# Patient Record
Sex: Male | Born: 1965 | Race: White | Hispanic: No | Marital: Married | Smoking: Never smoker
Health system: Southern US, Community
[De-identification: ages and names within clinical notes are randomized; demographics above are authoritative.]

## PROBLEM LIST (undated history)

## (undated) DIAGNOSIS — U071 COVID-19: Secondary | ICD-10-CM

## (undated) DIAGNOSIS — G4730 Sleep apnea, unspecified: Secondary | ICD-10-CM

## (undated) DIAGNOSIS — I499 Cardiac arrhythmia, unspecified: Secondary | ICD-10-CM

## (undated) HISTORY — PX: SPINAL FUSION: SHX223

## (undated) HISTORY — PX: ABLATION: SHX5711

---
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
## 2017-10-09 DIAGNOSIS — G4730 Sleep apnea, unspecified: Secondary | ICD-10-CM | POA: Diagnosis not present

## 2017-10-09 DIAGNOSIS — Z6834 Body mass index (BMI) 34.0-34.9, adult: Secondary | ICD-10-CM | POA: Diagnosis not present

## 2017-10-09 DIAGNOSIS — N6310 Unspecified lump in the right breast, unspecified quadrant: Secondary | ICD-10-CM | POA: Diagnosis not present

## 2017-10-09 DIAGNOSIS — N62 Hypertrophy of breast: Secondary | ICD-10-CM | POA: Diagnosis not present

## 2018-06-05 DIAGNOSIS — T7800XD Anaphylactic reaction due to unspecified food, subsequent encounter: Secondary | ICD-10-CM | POA: Diagnosis not present

## 2018-06-21 DIAGNOSIS — R197 Diarrhea, unspecified: Secondary | ICD-10-CM | POA: Diagnosis not present

## 2018-06-21 DIAGNOSIS — Z6832 Body mass index (BMI) 32.0-32.9, adult: Secondary | ICD-10-CM | POA: Diagnosis not present

## 2019-02-24 DIAGNOSIS — J020 Streptococcal pharyngitis: Secondary | ICD-10-CM | POA: Diagnosis not present

## 2019-02-24 DIAGNOSIS — B349 Viral infection, unspecified: Secondary | ICD-10-CM | POA: Diagnosis not present

## 2019-04-29 ENCOUNTER — Inpatient Hospital Stay
Admission: AD | Admit: 2019-04-29 | Payer: Self-pay | Source: Other Acute Inpatient Hospital | Admitting: Internal Medicine

## 2019-04-29 DIAGNOSIS — I471 Supraventricular tachycardia: Secondary | ICD-10-CM | POA: Diagnosis not present

## 2019-04-29 DIAGNOSIS — I248 Other forms of acute ischemic heart disease: Secondary | ICD-10-CM | POA: Diagnosis not present

## 2019-04-29 DIAGNOSIS — J301 Allergic rhinitis due to pollen: Secondary | ICD-10-CM | POA: Diagnosis not present

## 2019-04-29 DIAGNOSIS — U071 COVID-19: Secondary | ICD-10-CM

## 2019-04-29 DIAGNOSIS — R7989 Other specified abnormal findings of blood chemistry: Secondary | ICD-10-CM | POA: Diagnosis not present

## 2019-04-29 DIAGNOSIS — R0789 Other chest pain: Secondary | ICD-10-CM | POA: Diagnosis not present

## 2019-04-29 DIAGNOSIS — R002 Palpitations: Secondary | ICD-10-CM | POA: Diagnosis not present

## 2019-04-29 DIAGNOSIS — D72829 Elevated white blood cell count, unspecified: Secondary | ICD-10-CM | POA: Diagnosis not present

## 2019-04-29 DIAGNOSIS — Z809 Family history of malignant neoplasm, unspecified: Secondary | ICD-10-CM | POA: Diagnosis not present

## 2019-04-29 DIAGNOSIS — Z8619 Personal history of other infectious and parasitic diseases: Secondary | ICD-10-CM | POA: Diagnosis not present

## 2019-04-29 DIAGNOSIS — R778 Other specified abnormalities of plasma proteins: Secondary | ICD-10-CM | POA: Diagnosis not present

## 2019-04-29 DIAGNOSIS — M5010 Cervical disc disorder with radiculopathy, unspecified cervical region: Secondary | ICD-10-CM | POA: Diagnosis not present

## 2019-04-29 DIAGNOSIS — I214 Non-ST elevation (NSTEMI) myocardial infarction: Secondary | ICD-10-CM | POA: Diagnosis not present

## 2019-04-29 DIAGNOSIS — R079 Chest pain, unspecified: Secondary | ICD-10-CM | POA: Diagnosis not present

## 2019-04-29 DIAGNOSIS — Z79899 Other long term (current) drug therapy: Secondary | ICD-10-CM | POA: Diagnosis not present

## 2019-04-29 HISTORY — DX: COVID-19: U07.1

## 2019-05-13 ENCOUNTER — Encounter: Payer: Self-pay | Admitting: *Deleted

## 2019-05-13 ENCOUNTER — Other Ambulatory Visit: Payer: Self-pay

## 2019-05-13 ENCOUNTER — Ambulatory Visit (INDEPENDENT_AMBULATORY_CARE_PROVIDER_SITE_OTHER): Payer: Self-pay | Admitting: Cardiology

## 2019-05-13 VITALS — BP 110/80 | HR 53 | Ht 67.0 in | Wt 208.0 lb

## 2019-05-13 DIAGNOSIS — R079 Chest pain, unspecified: Secondary | ICD-10-CM

## 2019-05-14 NOTE — Progress Notes (Signed)
This patient was not seen by the provider - he was reschedule due to concerns for covid 10

## 2019-05-19 DIAGNOSIS — I471 Supraventricular tachycardia: Secondary | ICD-10-CM | POA: Diagnosis not present

## 2019-05-19 DIAGNOSIS — E6609 Other obesity due to excess calories: Secondary | ICD-10-CM | POA: Diagnosis not present

## 2019-05-19 DIAGNOSIS — Z6833 Body mass index (BMI) 33.0-33.9, adult: Secondary | ICD-10-CM | POA: Diagnosis not present

## 2019-06-19 DIAGNOSIS — G4733 Obstructive sleep apnea (adult) (pediatric): Secondary | ICD-10-CM | POA: Diagnosis not present

## 2019-06-19 DIAGNOSIS — I471 Supraventricular tachycardia: Secondary | ICD-10-CM | POA: Diagnosis not present

## 2019-07-28 DIAGNOSIS — Z20822 Contact with and (suspected) exposure to covid-19: Secondary | ICD-10-CM | POA: Diagnosis not present

## 2019-07-30 DIAGNOSIS — I471 Supraventricular tachycardia: Secondary | ICD-10-CM | POA: Diagnosis not present

## 2019-07-30 DIAGNOSIS — G4733 Obstructive sleep apnea (adult) (pediatric): Secondary | ICD-10-CM | POA: Diagnosis not present

## 2019-07-30 DIAGNOSIS — Z8616 Personal history of COVID-19: Secondary | ICD-10-CM | POA: Diagnosis not present

## 2019-07-30 DIAGNOSIS — Z79899 Other long term (current) drug therapy: Secondary | ICD-10-CM | POA: Diagnosis not present

## 2020-02-02 DIAGNOSIS — Z125 Encounter for screening for malignant neoplasm of prostate: Secondary | ICD-10-CM | POA: Diagnosis not present

## 2020-02-02 DIAGNOSIS — G4730 Sleep apnea, unspecified: Secondary | ICD-10-CM | POA: Diagnosis not present

## 2020-02-02 DIAGNOSIS — Z6835 Body mass index (BMI) 35.0-35.9, adult: Secondary | ICD-10-CM | POA: Diagnosis not present

## 2020-02-02 DIAGNOSIS — R339 Retention of urine, unspecified: Secondary | ICD-10-CM | POA: Diagnosis not present

## 2020-02-10 DIAGNOSIS — C61 Malignant neoplasm of prostate: Secondary | ICD-10-CM | POA: Diagnosis not present

## 2020-02-10 DIAGNOSIS — R972 Elevated prostate specific antigen [PSA]: Secondary | ICD-10-CM | POA: Diagnosis not present

## 2020-02-10 DIAGNOSIS — N401 Enlarged prostate with lower urinary tract symptoms: Secondary | ICD-10-CM | POA: Diagnosis not present

## 2020-03-01 DIAGNOSIS — R972 Elevated prostate specific antigen [PSA]: Secondary | ICD-10-CM | POA: Diagnosis not present

## 2020-03-01 DIAGNOSIS — N401 Enlarged prostate with lower urinary tract symptoms: Secondary | ICD-10-CM | POA: Diagnosis not present

## 2020-03-01 DIAGNOSIS — C61 Malignant neoplasm of prostate: Secondary | ICD-10-CM | POA: Diagnosis not present

## 2020-03-12 DIAGNOSIS — C61 Malignant neoplasm of prostate: Secondary | ICD-10-CM | POA: Diagnosis not present

## 2020-03-18 ENCOUNTER — Encounter: Payer: Self-pay | Admitting: *Deleted

## 2020-03-18 NOTE — Progress Notes (Signed)
Reached out to [employer] to introduce myself as the office RN Navigator and explain our new patient process. Reviewed the reason for their referral and scheduled their new patient appointment along with labs. Provided address and directions to the office including call back phone number. Reviewed with patient any concerns they may have or any possible barriers to attending their appointment.   Informed patient about my role as a navigator and that I will meet with them prior to their New Patient appointment and more fully discuss what services I can provide. At this time patient has no further questions or needs.   **Patient would like me to reach out to [facility] and see if they can see patient on a similar timeline as he lives much closer to that location. Will follow up with [facility] tomorrow.

## 2020-03-19 ENCOUNTER — Encounter: Payer: Self-pay | Admitting: *Deleted

## 2020-03-19 NOTE — Progress Notes (Signed)
Patient called back this morning and does not want to pursue an appointment at [facility] at this time. He would like to keep the appointment here with Dr Marin Olp.   He has scans scheduled at [facility] on Wednesday. I will follow up next week to try and get those results prior to his appointment with Dr Marin Olp on Friday.   Spoke to patient and confirmed the appointment with this office for next Friday. Address, directions and phone number provided.  Oncology Nurse Navigator Documentation  Oncology Nurse Navigator Flowsheets 03/19/2020  Abnormal Finding Date -  Confirmed Diagnosis Date -  Diagnosis Status -  Navigator Follow Up Date: 03/25/2020  Navigator Follow Up Reason: Radiology  Navigator Location [facility]  Referral Date to RadOnc/MedOnc -  Navigator Encounter Type Appt/Treatment Plan Review;Telephone  Telephone Incoming Call;Outgoing Call  Patient Visit Type MedOnc  Treatment Phase Pre-Tx/Tx Discussion  Barriers/Navigation Needs Coordination of Care;Education  Education Other  Interventions Coordination of Care  Acuity Level 2-Minimal Needs (1-2 Barriers Identified)  Coordination of Care -  Education Method Verbal  Support Groups/Services Friends and Family  Time Spent with Patient 30

## 2020-03-24 DIAGNOSIS — K760 Fatty (change of) liver, not elsewhere classified: Secondary | ICD-10-CM | POA: Diagnosis not present

## 2020-03-24 DIAGNOSIS — K429 Umbilical hernia without obstruction or gangrene: Secondary | ICD-10-CM | POA: Diagnosis not present

## 2020-03-24 DIAGNOSIS — M1712 Unilateral primary osteoarthritis, left knee: Secondary | ICD-10-CM | POA: Diagnosis not present

## 2020-03-24 DIAGNOSIS — K4020 Bilateral inguinal hernia, without obstruction or gangrene, not specified as recurrent: Secondary | ICD-10-CM | POA: Diagnosis not present

## 2020-03-24 DIAGNOSIS — K449 Diaphragmatic hernia without obstruction or gangrene: Secondary | ICD-10-CM | POA: Diagnosis not present

## 2020-03-24 DIAGNOSIS — C61 Malignant neoplasm of prostate: Secondary | ICD-10-CM | POA: Diagnosis not present

## 2020-03-24 DIAGNOSIS — K439 Ventral hernia without obstruction or gangrene: Secondary | ICD-10-CM | POA: Diagnosis not present

## 2020-03-24 DIAGNOSIS — Z8546 Personal history of malignant neoplasm of prostate: Secondary | ICD-10-CM | POA: Diagnosis not present

## 2020-03-25 ENCOUNTER — Other Ambulatory Visit: Payer: Self-pay | Admitting: Urology

## 2020-03-25 ENCOUNTER — Encounter: Payer: Self-pay | Admitting: *Deleted

## 2020-03-25 NOTE — Progress Notes (Signed)
Patient had CTs at [facility] 03/24/2020. He is scheduled to see Dr Marin Olp tomorrow. [facility] for CT reports and they request that a letter be faxed to [phone].  Results received and will be scanned into chart.  Oncology Nurse Navigator Documentation  Oncology Nurse Navigator Flowsheets 03/25/2020  Abnormal Finding Date -  Confirmed Diagnosis Date -  Diagnosis Status -  Navigator Follow Up Date: 03/26/2020  Navigator Follow Up Reason: New Patient Appointment  Navigator Location [facility]  Referral Date to RadOnc/MedOnc -  Navigator Encounter Type Diagnostic Results  Telephone -  Patient Visit Type MedOnc  Treatment Phase Pre-Tx/Tx Discussion  Barriers/Navigation Needs Coordination of Care;Education  Education -  Interventions Coordination of Care  Acuity Level 2-Minimal Needs (1-2 Barriers Identified)  Coordination of Care Other  Education Method -  Support Groups/Services Friends and Family  Time Spent with Patient 61

## 2020-03-26 ENCOUNTER — Encounter (INDEPENDENT_AMBULATORY_CARE_PROVIDER_SITE_OTHER): Payer: Self-pay

## 2020-03-26 ENCOUNTER — Encounter: Payer: Self-pay | Admitting: Hematology & Oncology

## 2020-03-26 ENCOUNTER — Other Ambulatory Visit: Payer: Self-pay

## 2020-03-26 ENCOUNTER — Encounter: Payer: Self-pay | Admitting: *Deleted

## 2020-03-26 ENCOUNTER — Inpatient Hospital Stay: Payer: BC Managed Care – PPO | Attending: Hematology & Oncology | Admitting: Hematology & Oncology

## 2020-03-26 ENCOUNTER — Inpatient Hospital Stay: Payer: BC Managed Care – PPO

## 2020-03-26 VITALS — BP 126/69 | HR 50 | Temp 98.2°F | Resp 18 | Wt 216.0 lb

## 2020-03-26 DIAGNOSIS — C61 Malignant neoplasm of prostate: Secondary | ICD-10-CM | POA: Diagnosis not present

## 2020-03-26 DIAGNOSIS — Z9079 Acquired absence of other genital organ(s): Secondary | ICD-10-CM | POA: Insufficient documentation

## 2020-03-26 DIAGNOSIS — Z7189 Other specified counseling: Secondary | ICD-10-CM

## 2020-03-26 HISTORY — DX: Malignant neoplasm of prostate: C61

## 2020-03-26 HISTORY — DX: Other specified counseling: Z71.89

## 2020-03-26 LAB — CMP (CANCER CENTER ONLY)
ALT: 48 U/L — ABNORMAL HIGH (ref 0–44)
AST: 28 U/L (ref 15–41)
Albumin: 4.2 g/dL (ref 3.5–5.0)
Alkaline Phosphatase: 92 U/L (ref 38–126)
Anion gap: 7 (ref 5–15)
BUN: 20 mg/dL (ref 6–20)
CO2: 28 mmol/L (ref 22–32)
Calcium: 9.6 mg/dL (ref 8.9–10.3)
Chloride: 107 mmol/L (ref 98–111)
Creatinine: 1.07 mg/dL (ref 0.61–1.24)
GFR, Est AFR Am: >60 mL/min (ref >60)
GFR, Estimated: >60 mL/min (ref >60)
Glucose, Bld: 94 mg/dL (ref 70–99)
Potassium: 4 mmol/L (ref 3.5–5.1)
Sodium: 142 mmol/L (ref 135–145)
Total Bilirubin: 0.4 mg/dL (ref 0.3–1.2)
Total Protein: 7 g/dL (ref 6.5–8.1)

## 2020-03-26 LAB — CBC WITH DIFFERENTIAL (CANCER CENTER ONLY)
Abs Immature Granulocytes: 0.03 10*3/uL (ref 0.00–0.07)
Basophils Absolute: 0.1 10*3/uL (ref 0.0–0.1)
Basophils Relative: 1 %
Eosinophils Absolute: 0.2 10*3/uL (ref 0.0–0.5)
Eosinophils Relative: 2 %
HCT: 47.4 % (ref 39.0–52.0)
Hemoglobin: 16.4 g/dL (ref 13.0–17.0)
Immature Granulocytes: 0 %
Lymphocytes Relative: 26 %
Lymphs Abs: 2.2 10*3/uL (ref 0.7–4.0)
MCH: 29.8 pg (ref 26.0–34.0)
MCHC: 34.6 g/dL (ref 30.0–36.0)
MCV: 86.2 fL (ref 80.0–100.0)
Monocytes Absolute: 0.6 10*3/uL (ref 0.1–1.0)
Monocytes Relative: 6 %
Neutro Abs: 5.6 10*3/uL (ref 1.7–7.7)
Neutrophils Relative %: 65 %
Platelet Count: 232 10*3/uL (ref 150–400)
RBC: 5.5 MIL/uL (ref 4.22–5.81)
RDW: 12.4 % (ref 11.5–15.5)
WBC Count: 8.6 10*3/uL (ref 4.0–10.5)
nRBC: 0 % (ref 0.0–0.2)

## 2020-03-26 NOTE — Progress Notes (Signed)
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  No chief complaint on file. : I have prostate cancer.  HPI: Mr. Caddell is a really nice 54 year old white male.  He comes in with his wife.  They are both incredibly nice.  They live in [place].  He works for [employer].  He has been in great health.  He used to work out quite a bit.  He did take anabolic steroids about 20 years ago or so.  Over the past couple months or so, he began to have some issues with urination.  There was some more difficulty with urination.  There is no blood in the urine.  He had no fever.  There is a little bit of pain.  He had some urinary frequency.  There has been no problems with his bowels.  He has had no bony pain.  There has been no weight loss or weight gain.  He has had no leg swelling.  He is subsequently was evaluated with an ultrasound.  This showed that he did have an enlarged prostate.  He had a PSA which was quite elevated.  I think the PSA was over 130.  He then was referred to urology.  He saw a urologist down in [place].  He subsequently underwent prostate biopsies.  These were done on 03/01/2020.  The pathology report ([id number]) showed high-grade adenocarcinoma the prostate in all 12 biopsies.  The Gleason score was 9.  There was extraprostatic extension and perineural invasion.  He wanted to see [place] at the [facility].  We are seeing him so that we can provide recommendations.  He did have a CT scan of the abdomen/pelvis done couple days ago.  There is no evidence of obvious metastatic disease.  There is no lymphadenopathy.  He had a bone scan done also on the same day.  The bone scan did not show any evidence of bony metastasis.  There is no history of prostate cancer in the family.  He does not smoke.  He does not drink alcohol.  He has  had no issues with pain.  There is no cough or shortness of breath.  He has had no swollen lymph nodes.  Overall, I would say his performance status is ECOG 0.    Past Medical History:  Diagnosis Date  . COVID-19 virus infection 04/29/2019   Last Assessment & Plan:  Asymptomatic preprocedural Covid screen positive Patient is not requiring any oxygen at present time Patient remains asymptomatic, D-dimer < 500. Advanced droplet precautions.  - Patient does not meet criteria for remdesivir or Decadron per Hospital treatment alogarithm  -- Supportive treatment  -- Will continue to Monitor.  :  :   Current Outpatient Medications:  .  aspirin 81 MG chewable tablet, Chew by mouth., Disp: , Rfl:  .  metoprolol tartrate (LOPRESSOR) 25 MG tablet, Take by mouth., Disp: , Rfl:  .  tamsulosin (FLOMAX) 0.4 MG CAPS capsule, Take 0.4 mg by mouth at bedtime., Disp: , Rfl: :  :  No Known Allergies:  No family history on file.:  Social History   Socioeconomic History  . Marital status: Married    Spouse name: Not on file  . Number of children: Not on file  . Years of education: Not on file  . Highest education level: Not on file  Occupational History  . Not on file  Tobacco Use  . Smoking status: Never Smoker  . Smokeless tobacco: Former Systems developer    Types: Snuff  Substance and Sexual Activity  . Alcohol use: Not Currently  . Drug use: Never  . Sexual activity: Not on file  Other Topics Concern  . Not on file  Social History Narrative  . Not on file   Social Determinants of Health   Financial Resource Strain:   . Difficulty of Paying Living Expenses: Not on file  Food Insecurity:   . Worried About Charity fundraiser in the Last Year: Not on file  . Ran Out of Food in the Last Year: Not on file  Transportation Needs:   . Lack of Transportation (Medical): Not on file  . Lack of Transportation (Non-Medical): Not on file  Physical Activity:   . Days of Exercise per Week: Not on file   . Minutes of Exercise per Session: Not on file  Stress:   . Feeling of Stress : Not on file  Social Connections:   . Frequency of Communication with Friends and Family: Not on file  . Frequency of Social Gatherings with Friends and Family: Not on file  . Attends Religious Services: Not on file  . Active Member of Clubs or Organizations: Not on file  . Attends Archivist Meetings: Not on file  . Marital Status: Not on file  Intimate Partner Violence:   . Fear of Current or Ex-Partner: Not on file  . Emotionally Abused: Not on file  . Physically Abused: Not on file  . Sexually Abused: Not on file  :  Review of Systems  Constitutional: Negative.   HENT: Negative.   Eyes: Negative.   Respiratory: Negative.   Cardiovascular: Negative.   Gastrointestinal: Negative.   Genitourinary: Positive for frequency and urgency.  Musculoskeletal: Negative.   Skin: Negative.   Neurological: Negative.   Endo/Heme/Allergies: Negative.   Psychiatric/Behavioral: Negative.      Exam:   This is a well-developed and well-nourished white male in no obvious distress.  Vital signs show temperature of 98.2.  Pulse 50.  Blood pressure 126/69.  Weight is 216 pounds.  Head and neck exam shows no ocular or oral lesions.  He has no palpable cervical or supraclavicular lymph nodes.  Lungs are clear bilaterally.  Cardiac exam regular rate and rhythm with no murmurs, rubs or bruits.  Abdomen is soft.  He has good bowel sounds.  There is no fluid wave.  There is no guarding or rebound tenderness.  There is no palpable liver or spleen tip.  Back exam shows no tenderness over the spine, ribs or hips.  Extremities shows no clubbing, cyanosis or edema.  He has good range of motion of his joints.  Skin exam shows no rashes, ecchymoses or petechia.  Neurological exam shows no focal neurological deficits.    @IPVITALS @   Recent Labs    03/26/20 1200  WBC 8.6  HGB 16.4  HCT 47.4  PLT 232   Recent Labs     03/26/20 1200  NA 142  K 4.0  CL 107  CO2 28  GLUCOSE 94  BUN 20  CREATININE 1.07  CALCIUM 9.6    Blood smear review: None  Pathology: See above    Assessment and Plan: Mr. Quinto is a very nice 54 year old white male.  I must say that he is quite young for this high-grade prostate cancer.  I do worry that there might be micrometastatic disease given the high  PSA and the fact that there is what appears to be extraprostatic spread of his tumor.  The Gleason score of 9 is also quite troublesome.  I realize that there is a newly approved nuclear medicine scan for prostate cancer.  This utilizes PSMA which is a high specific for prostate cancer.  This might be something we could utilize for evaluating for any metastatic disease.  I do think that he is a very good candidate for prostatectomy.  Given the high PSA in the concern regarding the possibility of micrometastatic disease, he might be a candidate for neoadjuvant therapy.  This will be basically androgen deprivation.  After that, then a prostatectomy could be done.  I have spoken with Dr. Dutch Gray of [facility].  He will see Mr.Pelot quickly.  He will talk to Mr. Lenz about what needs to be done.  Again, this is a very aggressive malignancy.  He does need to be treated with relatively high powered therapy.  Again I suspect that at some point he probably is going need some form of androgen deprivation for the possibility of micrometastatic disease.  He certainly might need postop radiation therapy depending on what the pathologic stage is.  This is a very interesting situation.  Mr. Heitman is in great shape.  He certainly would be amenable to aggressive therapy.  We will not schedule any follow-up as of yet until we know what the plans are for surgery.  I spent over an hour with he and his wife.  I answered all of their questions.  I gave each of them a prayer blanket which they were very thankful for.  I love their  faith.

## 2020-03-26 NOTE — Progress Notes (Signed)
Initial RN Navigator Patient Visit  Name: Gordon Scott Date of Referral : 03/16/2020 Diagnosis: Prostate Cancer  Met with patient prior to their visit with MD. Hanley Seamen patient "Your Patient Navigator" handout which explains my role, areas in which I am able to help, and all the contact information for myself and the office. Also gave patient MD and Navigator business card. Reviewed with patient the general overview of expected course after initial diagnosis and time frame for all steps to be completed.  New patient packet given to patient which includes: orientation to office and staff; campus directory; education on My Chart and Advance Directives; and patient centered education on prostate cancer.  Patient completed visit with Dr. Marin Olp.  Patient will need a referral to Dr Alinda Money.   Patient understands all follow up procedures and expectations. They have my number to reach out for any further clarification or additional needs.

## 2020-03-27 LAB — PSA, TOTAL AND FREE
PSA, Free Pct: 9 %
PSA, Free: 11.4 ng/mL
Prostate Specific Ag, Serum: 127 ng/mL — ABNORMAL HIGH (ref 0.0–4.0)

## 2020-03-29 ENCOUNTER — Telehealth: Payer: Self-pay | Admitting: Hematology & Oncology

## 2020-03-29 ENCOUNTER — Encounter: Payer: Self-pay | Admitting: *Deleted

## 2020-03-29 NOTE — Progress Notes (Signed)
Called patient to follow up on referral to Dr Lynne Logan office. Patient has not yet heard from Dr Lynne Logan office. Will call to follow up on referral.   During patient's new patient appointment, he mentioned interest in a nutrition consult. Reviewed with him, the schedule for 9/14 and 9/28 including options for in person and virtual visits. Patient would like the nutritionist to call his wife, Lynelle Smoke, at [phone].   Oncology Nurse Navigator Documentation  Oncology Nurse Navigator Flowsheets 03/29/2020  Abnormal Finding Date -  Confirmed Diagnosis Date -  Diagnosis Status -  Navigator Follow Up Date: -  Navigator Follow Up Reason: -  Navigator Location [facility]  Referral Date to RadOnc/MedOnc -  Navigator Encounter Type Appt/Treatment Plan Review;Telephone  Telephone Outgoing Call;Appt Confirmation/Clarification  Patient Visit Type MedOnc  Treatment Phase Pre-Tx/Tx Discussion  Barriers/Navigation Needs Coordination of Care;Education  Education -  Interventions Coordination of Care;Referrals;Psycho-Social Support  Acuity Level 2-Minimal Needs (1-2 Barriers Identified)  Referrals Nutrition/dietician  Coordination of Care Appts  Education Method -  Support Groups/Services Friends and Family  Time Spent with Patient 30

## 2020-03-29 NOTE — Progress Notes (Signed)
Request for BRCA testing on surgical pathology from 03/01/20 faxed to [place] at [facility] at [phone] per order of Dr. Marin Olp.

## 2020-03-29 NOTE — Telephone Encounter (Signed)
No los 9/10

## 2020-03-29 NOTE — Progress Notes (Signed)
Pt.'s records from [facility] and Dr. Antonieta Pert new pt note faxed to Dr. Lynne Logan office at [phone] per order of Dr. Marin Olp.

## 2020-03-30 ENCOUNTER — Telehealth: Payer: Self-pay | Admitting: Nutrition

## 2020-03-30 ENCOUNTER — Inpatient Hospital Stay: Payer: BC Managed Care – PPO | Admitting: Nutrition

## 2020-03-30 LAB — TESTOSTERONE: Testosterone: 264 ng/dL (ref 264–916)

## 2020-03-30 NOTE — Telephone Encounter (Signed)
Telephone consult completed with patient's wife.  Patient is a 53 year old male diagnosed with Prostate Cancer. Workup still in progress. Tammy has a lot of questions about a healthy diet for prostate cancer patients.  Educated on plant based diet with increased fruits, vegetables, and whole grains. Encouraged lean proteins. Reviewed "sugar and cancer", dairy recommendations, and healthy snacks. Will mail fact sheets along with web sites for further information. Provide contact information.

## 2020-03-30 NOTE — Progress Notes (Signed)
See telephone note.

## 2020-03-31 ENCOUNTER — Encounter: Payer: Self-pay | Admitting: *Deleted

## 2020-03-31 NOTE — Progress Notes (Signed)
Confirmed with Dr Lynne Logan office that patient has an appointment on 04/06/2020. Dr Marin Olp notified.   Oncology Nurse Navigator Documentation  Oncology Nurse Navigator Flowsheets 03/31/2020  Abnormal Finding Date -  Confirmed Diagnosis Date -  Diagnosis Status -  Navigator Follow Up Date: 04/06/2020  Navigator Follow Up Reason: Other:  Navigator Location [facility]  Referral Date to RadOnc/MedOnc -  Navigator Encounter Type Appt/Treatment Plan Review  Telephone Outgoing Call  Patient Visit Type MedOnc  Treatment Phase Pre-Tx/Tx Discussion  Barriers/Navigation Needs Coordination of Care;Education  Education -  Interventions Coordination of Care  Acuity Level 2-Minimal Needs (1-2 Barriers Identified)  Referrals -  Coordination of Care Appts  Education Method -  Support Groups/Services Friends and Family  Time Spent with Patient 15

## 2020-04-06 DIAGNOSIS — C61 Malignant neoplasm of prostate: Secondary | ICD-10-CM | POA: Diagnosis not present

## 2020-04-07 ENCOUNTER — Other Ambulatory Visit (HOSPITAL_COMMUNITY): Payer: Self-pay | Admitting: Urology

## 2020-04-07 DIAGNOSIS — C61 Malignant neoplasm of prostate: Secondary | ICD-10-CM

## 2020-04-09 ENCOUNTER — Encounter: Payer: Self-pay | Admitting: *Deleted

## 2020-04-09 NOTE — Progress Notes (Signed)
[facility] for office note from recent consultations. Records received and given to Dr Marin Olp. Patient will have PET and then decide on surgery vs clinical trial.   Oncology Nurse Navigator Documentation  Oncology Nurse Navigator Flowsheets 04/09/2020  Abnormal Finding Date -  Confirmed Diagnosis Date -  Diagnosis Status -  Navigator Follow Up Date: 04/14/2020  Navigator Follow Up Reason: Radiology  Navigator Location [facility]  Referral Date to RadOnc/MedOnc -  Navigator Encounter Type Appt/Treatment Plan Review  Telephone -  Patient Visit Type MedOnc  Treatment Phase Pre-Tx/Tx Discussion  Barriers/Navigation Needs Coordination of Care;Education  Education -  Interventions Other  Acuity Level 2-Minimal Needs (1-2 Barriers Identified)  Referrals -  Coordination of Care -  Education Method -  Support Groups/Services Friends and Family  Time Spent with Patient 30

## 2020-04-14 ENCOUNTER — Ambulatory Visit (HOSPITAL_COMMUNITY): Admission: RE | Admit: 2020-04-14 | Payer: BC Managed Care – PPO | Source: Ambulatory Visit

## 2020-04-23 ENCOUNTER — Encounter: Payer: Self-pay | Admitting: *Deleted

## 2020-04-23 NOTE — Progress Notes (Signed)
Called Dr Lynne Logan office and spoke to his RN, Mickel Baas for patient update. Patient's PET scan was denied by insurance. Their office is currently working through the appeal. Once they can obtain the PET, their current plan is a prostatectomy. Will continue to follow.  Oncology Nurse Navigator Documentation  Oncology Nurse Navigator Flowsheets 04/23/2020  Abnormal Finding Date -  Confirmed Diagnosis Date -  Diagnosis Status -  Navigator Follow Up Date: 04/28/2020  Navigator Follow Up Reason: Appointment Review  Navigator Location [facility]  Referral Date to RadOnc/MedOnc -  Navigator Encounter Type Appt/Treatment Plan Review  Telephone -  Patient Visit Type MedOnc  Treatment Phase Pre-Tx/Tx Discussion  Barriers/Navigation Needs Coordination of Care  Education -  Interventions Coordination of Care  Acuity Level 2-Minimal Needs (1-2 Barriers Identified)  Referrals -  Coordination of Care Other  Education Method -  Support Groups/Services Friends and Family  Time Spent with Patient 30

## 2020-04-30 ENCOUNTER — Encounter: Payer: Self-pay | Admitting: *Deleted

## 2020-04-30 NOTE — Progress Notes (Signed)
Received notification from Dr Lynne Logan office that patient's PET was not approved by insurance. Plan of care is now enrollment in the PROTEUS Trial. He will be seen by the urologist soon for trial screening. Plan of care reviewed with Dr Marin Olp. He would like to see patient at the end of November for follow up. Message sent to scheduler.   Oncology Nurse Navigator Documentation  Oncology Nurse Navigator Flowsheets 04/30/2020  Abnormal Finding Date -  Confirmed Diagnosis Date -  Diagnosis Status -  Navigator Follow Up Date: 06/08/2020  Navigator Follow Up Reason: Follow-up Appointment  Navigator Location [facility]  Referral Date to RadOnc/MedOnc -  Navigator Encounter Type Appt/Treatment Plan Review  Telephone -  Patient Visit Type MedOnc  Treatment Phase Pre-Tx/Tx Discussion  Barriers/Navigation Needs Coordination of Care  Education -  Interventions Coordination of Care  Acuity Level 2-Minimal Needs (1-2 Barriers Identified)  Referrals -  Coordination of Care Appts  Education Method -  Support Groups/Services Friends and Family  Time Spent with Patient 30

## 2020-05-12 DIAGNOSIS — C61 Malignant neoplasm of prostate: Secondary | ICD-10-CM | POA: Diagnosis not present

## 2020-05-17 DIAGNOSIS — Z981 Arthrodesis status: Secondary | ICD-10-CM | POA: Diagnosis not present

## 2020-05-17 DIAGNOSIS — Z8546 Personal history of malignant neoplasm of prostate: Secondary | ICD-10-CM | POA: Diagnosis not present

## 2020-06-02 DIAGNOSIS — N50812 Left testicular pain: Secondary | ICD-10-CM | POA: Diagnosis not present

## 2020-06-08 ENCOUNTER — Other Ambulatory Visit: Payer: Self-pay

## 2020-06-08 ENCOUNTER — Inpatient Hospital Stay: Payer: BC Managed Care – PPO | Attending: Hematology & Oncology

## 2020-06-08 ENCOUNTER — Inpatient Hospital Stay (HOSPITAL_BASED_OUTPATIENT_CLINIC_OR_DEPARTMENT_OTHER): Payer: BC Managed Care – PPO | Admitting: Hematology & Oncology

## 2020-06-08 VITALS — BP 127/66 | HR 57 | Temp 98.2°F | Resp 18 | Ht 67.0 in | Wt 207.0 lb

## 2020-06-08 DIAGNOSIS — Z79899 Other long term (current) drug therapy: Secondary | ICD-10-CM | POA: Insufficient documentation

## 2020-06-08 DIAGNOSIS — C61 Malignant neoplasm of prostate: Secondary | ICD-10-CM | POA: Insufficient documentation

## 2020-06-08 DIAGNOSIS — M898X9 Other specified disorders of bone, unspecified site: Secondary | ICD-10-CM | POA: Diagnosis not present

## 2020-06-08 DIAGNOSIS — R5383 Other fatigue: Secondary | ICD-10-CM | POA: Diagnosis not present

## 2020-06-08 LAB — CMP (CANCER CENTER ONLY)
ALT: 42 U/L (ref 0–44)
AST: 16 U/L (ref 15–41)
Albumin: 4.1 g/dL (ref 3.5–5.0)
Alkaline Phosphatase: 107 U/L (ref 38–126)
Anion gap: 5 (ref 5–15)
BUN: 28 mg/dL — ABNORMAL HIGH (ref 6–20)
CO2: 30 mmol/L (ref 22–32)
Calcium: 9.9 mg/dL (ref 8.9–10.3)
Chloride: 108 mmol/L (ref 98–111)
Creatinine: 1.26 mg/dL — ABNORMAL HIGH (ref 0.61–1.24)
GFR, Estimated: >60 mL/min (ref >60)
Glucose, Bld: 89 mg/dL (ref 70–99)
Potassium: 4.6 mmol/L (ref 3.5–5.1)
Sodium: 143 mmol/L (ref 135–145)
Total Bilirubin: 0.2 mg/dL — ABNORMAL LOW (ref 0.3–1.2)
Total Protein: 7 g/dL (ref 6.5–8.1)

## 2020-06-08 LAB — CBC WITH DIFFERENTIAL (CANCER CENTER ONLY)
Abs Immature Granulocytes: 0.02 10*3/uL (ref 0.00–0.07)
Basophils Absolute: 0.1 10*3/uL (ref 0.0–0.1)
Basophils Relative: 1 %
Eosinophils Absolute: 0.1 10*3/uL (ref 0.0–0.5)
Eosinophils Relative: 1 %
HCT: 47.7 % (ref 39.0–52.0)
Hemoglobin: 16.2 g/dL (ref 13.0–17.0)
Immature Granulocytes: 0 %
Lymphocytes Relative: 21 %
Lymphs Abs: 1.5 10*3/uL (ref 0.7–4.0)
MCH: 29.9 pg (ref 26.0–34.0)
MCHC: 34 g/dL (ref 30.0–36.0)
MCV: 88 fL (ref 80.0–100.0)
Monocytes Absolute: 0.5 10*3/uL (ref 0.1–1.0)
Monocytes Relative: 7 %
Neutro Abs: 5 10*3/uL (ref 1.7–7.7)
Neutrophils Relative %: 70 %
Platelet Count: 221 10*3/uL (ref 150–400)
RBC: 5.42 MIL/uL (ref 4.22–5.81)
RDW: 12.6 % (ref 11.5–15.5)
WBC Count: 7.2 10*3/uL (ref 4.0–10.5)
nRBC: 0 % (ref 0.0–0.2)

## 2020-06-08 NOTE — Progress Notes (Signed)
Hematology and Oncology Follow Up Visit  Gordon Scott [id number] 01-15-1966 54 y.o. 06/08/2020   Principle Diagnosis:   Prostate cancer -- High risk -- localized  Current Therapy:    Neoadjuvant therapy -- Clinical Trial out of [facility]     Interim History:  Gordon Scott is back for second office visit.  We first saw him back in September.  At that time, he had a high risk localized prostate cancer.  His PSA was 130.  There is no evidence of metastatic disease.  He was seen by Dr. Alinda Money of [facility].  Dr. Alinda Money would have put him on a neoadjuvant clinical trial.  Sounds like he is getting Lupron and then he has been randomized to either apalutamide or placebo.  He has had some hot flashes already.  He does feel a bit more tired.  He does have some fatigue.  He has some bony pains.  He has had no problems with bowels or bladder.  He has had no issues with fever.  He said no cough.  He has had no leg swelling.  There is been no problems with nausea or vomiting.  His appetite is quite good.  Sounds like he will have a nice Thanksgiving with his family.  Overall, his performance status is ECOG 0.    Medications:  Current Outpatient Medications:  .  apalutamide (ERLEADA) 60 MG tablet, Take 240 mg by mouth daily. May be taken with or without food. Swallow tablets whole., Disp: , Rfl:  .  meloxicam (MOBIC) 15 MG tablet, Take 15 mg by mouth daily., Disp: , Rfl:  .  tamsulosin (FLOMAX) 0.4 MG CAPS capsule, Take 0.4 mg by mouth at bedtime., Disp: , Rfl:   Allergies: No Known Allergies  Past Medical History, Surgical history, Social history, and Family History were reviewed and updated.  Review of Systems: Review of Systems  Constitutional: Negative.   HENT:  Negative.   Eyes: Negative.   Respiratory: Negative.   Gastrointestinal: Negative.   Endocrine: Positive for hot flashes.  Genitourinary: Negative.    Musculoskeletal: Positive for arthralgias.   Neurological: Negative.   Hematological: Negative.   Psychiatric/Behavioral: Negative.     Physical Exam:  height is 5\' 7"  (1.702 m) and weight is 207 lb (93.9 kg). His oral temperature is 98.2 F (36.8 C). His blood pressure is 127/66 and his pulse is 57 (abnormal). His respiration is 18 and oxygen saturation is 98%.   Wt Readings from Last 3 Encounters:  06/08/20 207 lb (93.9 kg)  03/26/20 216 lb (98 kg)  05/13/19 208 lb (94.3 kg)    Physical Exam Vitals reviewed.  HENT:     Head: Normocephalic and atraumatic.  Eyes:     Pupils: Pupils are equal, round, and reactive to light.  Cardiovascular:     Rate and Rhythm: Normal rate and regular rhythm.     Heart sounds: Normal heart sounds.  Pulmonary:     Effort: Pulmonary effort is normal.     Breath sounds: Normal breath sounds.  Abdominal:     General: Bowel sounds are normal.     Palpations: Abdomen is soft.  Musculoskeletal:        General: No tenderness or deformity. Normal range of motion.     Cervical back: Normal range of motion.  Lymphadenopathy:     Cervical: No cervical adenopathy.  Skin:    General: Skin is warm and dry.     Findings: No erythema or rash.  Neurological:     Mental Status: He is alert and oriented to person, place, and time.  Psychiatric:        Behavior: Behavior normal.        Thought Content: Thought content normal.        Judgment: Judgment normal.    Lab Results  Component Value Date   WBC 7.2 06/08/2020   HGB 16.2 06/08/2020   HCT 47.7 06/08/2020   MCV 88.0 06/08/2020   PLT 221 06/08/2020     Chemistry      Component Value Date/Time   NA 143 06/08/2020 1502   K 4.6 06/08/2020 1502   CL 108 06/08/2020 1502   CO2 30 06/08/2020 1502   BUN 28 (H) 06/08/2020 1502   CREATININE 1.26 (H) 06/08/2020 1502      Component Value Date/Time   CALCIUM 9.9 06/08/2020 1502   ALKPHOS 107 06/08/2020 1502   AST 16 06/08/2020 1502   ALT 42 06/08/2020 1502   BILITOT 0.2 (L) 06/08/2020 1502       Impression and Plan: Gordon Scott is a very nice 54 year old white male.  He has a very high risk localized prostate cancer.  He is on neoadjuvant therapy right now.  We will go ahead and see what his testosterone and PSA is.  I would have to leave that just with the Lupron itself, the PSA should be down.  Hopefully, he will have a good response to therapy so that he will be able to have a prostatectomy.  As outlined the prostatectomy will be done in May.  I would like to see him back in 3 months.  He sees the urologist every month.  I am sure he gets his PSA checked at that time.   Volanda Napoleon, MD 11/23/20214:48 PM

## 2020-06-09 ENCOUNTER — Encounter: Payer: Self-pay | Admitting: *Deleted

## 2020-06-09 ENCOUNTER — Telehealth: Payer: Self-pay | Admitting: Hematology & Oncology

## 2020-06-09 LAB — PSA, TOTAL AND FREE
PSA, Free Pct: 5.7 %
PSA, Free: 1.01 ng/mL
Prostate Specific Ag, Serum: 17.6 ng/mL — ABNORMAL HIGH (ref 0.0–4.0)

## 2020-06-09 LAB — TESTOSTERONE: Testosterone: 6 ng/dL — ABNORMAL LOW (ref 264–916)

## 2020-06-09 NOTE — Telephone Encounter (Signed)
Appointments scheduled calendar printed & mailed per 11/23 los 

## 2020-06-09 NOTE — Progress Notes (Signed)
Oncology Nurse Navigator Documentation  Oncology Nurse Navigator Flowsheets 06/09/2020  Abnormal Finding Date -  Confirmed Diagnosis Date -  Diagnosis Status -  Navigator Follow Up Date: -  Navigator Follow Up Reason: -  Navigation Complete Date: 06/09/2020  Post Navigation: Continue to Follow Patient? No  Reason Not Navigating Patient: Patient On Maintenance Chemotherapy  Navigator Location [facility]  Referral Date to RadOnc/MedOnc -  Navigator Encounter Type Appt/Treatment Plan Review  Telephone -  Patient Visit Type MedOnc  Treatment Phase Active Tx  Barriers/Navigation Needs Coordination of Care  Education -  Interventions None Required  Acuity Level 1-No Barriers  Referrals -  Coordination of Care -  Education Method -  Support Groups/Services Friends and Family  Time Spent with Patient 15

## 2020-06-24 ENCOUNTER — Other Ambulatory Visit: Payer: Self-pay | Admitting: Urology

## 2020-07-14 DIAGNOSIS — B349 Viral infection, unspecified: Secondary | ICD-10-CM | POA: Diagnosis not present

## 2020-07-14 DIAGNOSIS — J069 Acute upper respiratory infection, unspecified: Secondary | ICD-10-CM | POA: Diagnosis not present

## 2020-08-24 DIAGNOSIS — C61 Malignant neoplasm of prostate: Secondary | ICD-10-CM | POA: Diagnosis not present

## 2020-09-08 ENCOUNTER — Inpatient Hospital Stay (HOSPITAL_BASED_OUTPATIENT_CLINIC_OR_DEPARTMENT_OTHER): Payer: BC Managed Care – PPO | Admitting: Hematology & Oncology

## 2020-09-08 ENCOUNTER — Telehealth: Payer: Self-pay

## 2020-09-08 ENCOUNTER — Inpatient Hospital Stay: Payer: BC Managed Care – PPO | Attending: Hematology & Oncology

## 2020-09-08 ENCOUNTER — Encounter: Payer: Self-pay | Admitting: Hematology & Oncology

## 2020-09-08 ENCOUNTER — Other Ambulatory Visit: Payer: Self-pay

## 2020-09-08 VITALS — BP 108/74 | HR 58 | Temp 98.7°F | Resp 18 | Ht 67.0 in | Wt 204.0 lb

## 2020-09-08 DIAGNOSIS — C61 Malignant neoplasm of prostate: Secondary | ICD-10-CM | POA: Insufficient documentation

## 2020-09-08 DIAGNOSIS — M2550 Pain in unspecified joint: Secondary | ICD-10-CM | POA: Diagnosis not present

## 2020-09-08 DIAGNOSIS — Z9079 Acquired absence of other genital organ(s): Secondary | ICD-10-CM | POA: Insufficient documentation

## 2020-09-08 DIAGNOSIS — Z79899 Other long term (current) drug therapy: Secondary | ICD-10-CM | POA: Diagnosis not present

## 2020-09-08 LAB — CBC WITH DIFFERENTIAL (CANCER CENTER ONLY)
Abs Immature Granulocytes: 0.03 10*3/uL (ref 0.00–0.07)
Basophils Absolute: 0.1 10*3/uL (ref 0.0–0.1)
Basophils Relative: 1 %
Eosinophils Absolute: 0.1 10*3/uL (ref 0.0–0.5)
Eosinophils Relative: 2 %
HCT: 43.4 % (ref 39.0–52.0)
Hemoglobin: 15.1 g/dL (ref 13.0–17.0)
Immature Granulocytes: 1 %
Lymphocytes Relative: 23 %
Lymphs Abs: 1.5 10*3/uL (ref 0.7–4.0)
MCH: 30.3 pg (ref 26.0–34.0)
MCHC: 34.8 g/dL (ref 30.0–36.0)
MCV: 87.1 fL (ref 80.0–100.0)
Monocytes Absolute: 0.5 10*3/uL (ref 0.1–1.0)
Monocytes Relative: 8 %
Neutro Abs: 4.3 10*3/uL (ref 1.7–7.7)
Neutrophils Relative %: 65 %
Platelet Count: 230 10*3/uL (ref 150–400)
RBC: 4.98 MIL/uL (ref 4.22–5.81)
RDW: 13.1 % (ref 11.5–15.5)
WBC Count: 6.6 10*3/uL (ref 4.0–10.5)
nRBC: 0 % (ref 0.0–0.2)

## 2020-09-08 LAB — CMP (CANCER CENTER ONLY)
ALT: 14 U/L (ref 0–44)
AST: 14 U/L — ABNORMAL LOW (ref 15–41)
Albumin: 4 g/dL (ref 3.5–5.0)
Alkaline Phosphatase: 112 U/L (ref 38–126)
Anion gap: 7 (ref 5–15)
BUN: 15 mg/dL (ref 6–20)
CO2: 27 mmol/L (ref 22–32)
Calcium: 9.3 mg/dL (ref 8.9–10.3)
Chloride: 104 mmol/L (ref 98–111)
Creatinine: 1.03 mg/dL (ref 0.61–1.24)
GFR, Estimated: >60 mL/min (ref >60)
Glucose, Bld: 99 mg/dL (ref 70–99)
Potassium: 3.9 mmol/L (ref 3.5–5.1)
Sodium: 138 mmol/L (ref 135–145)
Total Bilirubin: 0.2 mg/dL — ABNORMAL LOW (ref 0.3–1.2)
Total Protein: 6.8 g/dL (ref 6.5–8.1)

## 2020-09-08 NOTE — Telephone Encounter (Signed)
S/w pt and he is aware of his June appts per 09/08/20 los    [employer]

## 2020-09-08 NOTE — Progress Notes (Signed)
Hematology and Oncology Follow Up Visit  Gordon Scott [id number] 1965-10-21 55 y.o. 09/08/2020   Principle Diagnosis:   Prostate cancer -- High risk -- localized  Current Therapy:    Neoadjuvant therapy -- Clinical Trial out of [facility]     Interim History:  Gordon Scott is back for follow-up.  He is doing okay.  He still on protocol for his locally advanced prostate cancer.  He is getting Lupron.  Surprisingly, he was found that his testosterone went back up with Lupron.  He got his 95-month Lupron injection.  It certainly possible that he may need the more frequent Lupron injection because of his young age.  We last saw him, his PSA was 17.6.  His testosterone level was I think 6.  He does have some arthralgias.  This might be from testosterone depletion.  He does not know if he is getting the oral placebo or the apalutamide.  His prostatectomy schedule for May 23.  He has had no problems with bowels or bladder.  He has had no problems with cough or shortness of breath.  There is been no leg swelling.  He has had no diarrhea.  He has had no cough.  He does state that he does have some hot flashes.  Over this is a sign that his testosterone level is low.  Overall, his performance status is ECOG 0.    Medications:  Current Outpatient Medications:  .  apalutamide (ERLEADA) 60 MG tablet, Take 240 mg by mouth daily. May be taken with or without food. Swallow tablets whole., Disp: , Rfl:  .  meloxicam (MOBIC) 15 MG tablet, Take 15 mg by mouth daily., Disp: , Rfl:  .  tamsulosin (FLOMAX) 0.4 MG CAPS capsule, Take 0.4 mg by mouth at bedtime., Disp: , Rfl:   Allergies: No Known Allergies  Past Medical History, Surgical history, Social history, and Family History were reviewed and updated.  Review of Systems: Review of Systems  Constitutional: Negative.   HENT:  Negative.   Eyes: Negative.   Respiratory: Negative.   Gastrointestinal: Negative.   Endocrine: Positive for  hot flashes.  Genitourinary: Negative.    Musculoskeletal: Positive for arthralgias.  Neurological: Negative.   Hematological: Negative.   Psychiatric/Behavioral: Negative.     Physical Exam:  vitals were not taken for this visit.   Wt Readings from Last 3 Encounters:  06/08/20 207 lb (93.9 kg)  03/26/20 216 lb (98 kg)  05/13/19 208 lb (94.3 kg)    Physical Exam Vitals reviewed.  HENT:     Head: Normocephalic and atraumatic.  Eyes:     Pupils: Pupils are equal, round, and reactive to light.  Cardiovascular:     Rate and Rhythm: Normal rate and regular rhythm.     Heart sounds: Normal heart sounds.  Pulmonary:     Effort: Pulmonary effort is normal.     Breath sounds: Normal breath sounds.  Abdominal:     General: Bowel sounds are normal.     Palpations: Abdomen is soft.  Musculoskeletal:        General: No tenderness or deformity. Normal range of motion.     Cervical back: Normal range of motion.  Lymphadenopathy:     Cervical: No cervical adenopathy.  Skin:    General: Skin is warm and dry.     Findings: No erythema or rash.  Neurological:     Mental Status: He is alert and oriented to person, place, and time.  Psychiatric:  Behavior: Behavior normal.        Thought Content: Thought content normal.        Judgment: Judgment normal.    Lab Results  Component Value Date   WBC 6.6 09/08/2020   HGB 15.1 09/08/2020   HCT 43.4 09/08/2020   MCV 87.1 09/08/2020   PLT 230 09/08/2020     Chemistry      Component Value Date/Time   NA 143 06/08/2020 1502   K 4.6 06/08/2020 1502   CL 108 06/08/2020 1502   CO2 30 06/08/2020 1502   BUN 28 (H) 06/08/2020 1502   CREATININE 1.26 (H) 06/08/2020 1502      Component Value Date/Time   CALCIUM 9.9 06/08/2020 1502   ALKPHOS 107 06/08/2020 1502   AST 16 06/08/2020 1502   ALT 42 06/08/2020 1502   BILITOT 0.2 (L) 06/08/2020 1502      Impression and Plan: Gordon Scott is a very nice 55 year old white male.  He has  a very high risk localized prostate cancer.  He is on neoadjuvant therapy right now.  He is on a clinical trial.  Again, we will have to see him back after his prostatectomy.  We will see what his testosterone and PSA level is today.  I have to believe that he is likely on the placebo.  I just hope that he has a good response and that there is minimal disease with his surgical specimen.    Volanda Napoleon, MD 2/23/20223:07 PM

## 2020-09-09 ENCOUNTER — Encounter: Payer: Self-pay | Admitting: *Deleted

## 2020-09-09 LAB — PSA, TOTAL AND FREE
PSA, Free Pct: 20 %
PSA, Free: 0.02 ng/mL
Prostate Specific Ag, Serum: 0.1 ng/mL (ref 0.0–4.0)

## 2020-09-09 LAB — TESTOSTERONE: Testosterone: 571 ng/dL (ref 264–916)

## 2020-09-20 ENCOUNTER — Other Ambulatory Visit (HOSPITAL_COMMUNITY): Payer: Self-pay | Admitting: Urology

## 2020-09-20 ENCOUNTER — Other Ambulatory Visit: Payer: Self-pay | Admitting: Urology

## 2020-09-20 DIAGNOSIS — C61 Malignant neoplasm of prostate: Secondary | ICD-10-CM

## 2020-10-14 DIAGNOSIS — M6281 Muscle weakness (generalized): Secondary | ICD-10-CM | POA: Diagnosis not present

## 2020-10-14 DIAGNOSIS — C61 Malignant neoplasm of prostate: Secondary | ICD-10-CM | POA: Diagnosis not present

## 2020-11-01 DIAGNOSIS — M62838 Other muscle spasm: Secondary | ICD-10-CM | POA: Diagnosis not present

## 2020-11-01 DIAGNOSIS — M6281 Muscle weakness (generalized): Secondary | ICD-10-CM | POA: Diagnosis not present

## 2020-11-01 DIAGNOSIS — N393 Stress incontinence (female) (male): Secondary | ICD-10-CM | POA: Diagnosis not present

## 2020-11-23 DIAGNOSIS — C61 Malignant neoplasm of prostate: Secondary | ICD-10-CM | POA: Diagnosis not present

## 2020-11-30 NOTE — Progress Notes (Signed)
DUE TO COVID-19 ONLY ONE VISITOR IS ALLOWED TO COME WITH YOU AND STAY IN THE WAITING ROOM ONLY DURING PRE OP AND PROCEDURE DAY OF SURGERY. THE 1 VISITOR  MAY VISIT WITH YOU AFTER SURGERY IN YOUR PRIVATE ROOM DURING VISITING HOURS ONLY!  YOU NEED TO HAVE A COVID 19 TEST ON__5/19/2022 _____ @__1050am  _____, THIS TEST MUST BE DONE BEFORE SURGERY,  COVID TESTING SITE [address] [place] [address], IT IS ON THE RIGHT GOING OUT [address] APPROXIMATELY  2 MINUTES PAST [employer] ON THE RIGHT. ONCE YOUR COVID TEST IS COMPLETED,  PLEASE BEGIN THE QUARANTINE INSTRUCTIONS AS OUTLINED IN YOUR HANDOUT.                Gordon Scott  11/30/2020   Your procedure is scheduled on:  12/06/2020   Report to [facility] Main  Entrance   Report to admitting at     [place] AM     Call this number if you have problems the morning of surgery [phone]    Remember: Do not eat food , candy gum or mints :After Midnight. You may have clear liquids from midnight until 0415 am     CLEAR LIQUID DIET   Foods Allowed                                                                       Coffee and tea, regular and decaf                              Plain Jell-O any favor except red or purple                                            Fruit ices (not with fruit pulp)                                      Iced Popsicles                                     Carbonated beverages, regular and diet                                    Cranberry, grape and apple juices Sports drinks like Gatorade Lightly seasoned clear broth or consume(fat free) Sugar, honey syrup   _____________________________________________________________________    BRUSH YOUR TEETH MORNING OF SURGERY AND RINSE YOUR MOUTH OUT, NO CHEWING GUM CANDY OR MINTS.     Take these medicines the morning of surgery with A SIP OF WATER:   none  DO NOT TAKE ANY DIABETIC MEDICATIONS DAY OF YOUR SURGERY                                You may not have  any metal on your body including hair pins and              piercings  Do not wear jewelry, make-up, lotions, powders or perfumes, deodorant             Do not wear nail polish on your fingernails.  Do not shave  48 hours prior to surgery.              Men may shave face and neck.   Do not bring valuables to the hospital. Gordon Scott.  Contacts, dentures or bridgework may not be worn into surgery.  Leave suitcase in the car. After surgery it may be brought to your room.     Patients discharged the day of surgery will not be allowed to drive home. IF YOU ARE HAVING SURGERY AND GOING HOME THE SAME DAY, YOU MUST HAVE AN ADULT TO DRIVE YOU HOME AND BE WITH YOU FOR 24 HOURS. YOU MAY GO HOME BY TAXI OR UBER OR ORTHERWISE, BUT AN ADULT MUST ACCOMPANY YOU HOME AND STAY WITH YOU FOR 24 HOURS.  Name and phone number of your driver:  Special Instructions: N/A              Please read over the following fact sheets you were given: _____________________________________________________________________  [facility] - Preparing for Surgery Before surgery, you can play an important role.  Because skin is not sterile, your skin needs to be as free of germs as possible.  You can reduce the number of germs on your skin by washing with CHG (chlorahexidine gluconate) soap before surgery.  CHG is an antiseptic cleaner which kills germs and bonds with the skin to continue killing germs even after washing. Please DO NOT use if you have an allergy to CHG or antibacterial soaps.  If your skin becomes reddened/irritated stop using the CHG and inform your nurse when you arrive at Short Stay. Do not shave (including legs and underarms) for at least 48 hours prior to the first CHG shower.  You may shave your face/neck. Please follow these instructions carefully:  1.  Shower with CHG Soap the night before surgery and the  morning of Surgery.  2.  If  you choose to wash your hair, wash your hair first as usual with your  normal  shampoo.  3.  After you shampoo, rinse your hair and body thoroughly to remove the  shampoo.                           4.  Use CHG as you would any other liquid soap.  You can apply chg directly  to the skin and wash                       Gently with a scrungie or clean washcloth.  5.  Apply the CHG Soap to your body ONLY FROM THE NECK DOWN.   Do not use on face/ open                           Wound or open sores. Avoid contact with eyes, ears mouth and genitals (private parts).                       Wash  face,  Genitals (private parts) with your normal soap.             6.  Wash thoroughly, paying special attention to the area where your surgery  will be performed.  7.  Thoroughly rinse your body with warm water from the neck down.  8.  DO NOT shower/wash with your normal soap after using and rinsing off  the CHG Soap.                9.  Pat yourself dry with a clean towel.            10.  Wear clean pajamas.            11.  Place clean sheets on your bed the night of your first shower and do not  sleep with pets. Day of Surgery : Do not apply any lotions/deodorants the morning of surgery.  Please wear clean clothes to the hospital/surgery center.  FAILURE TO FOLLOW THESE INSTRUCTIONS MAY RESULT IN THE CANCELLATION OF YOUR SURGERY PATIENT SIGNATURE_________________________________  NURSE SIGNATURE__________________________________  ________________________________________________________________________

## 2020-12-02 ENCOUNTER — Encounter (HOSPITAL_COMMUNITY)
Admission: RE | Admit: 2020-12-02 | Discharge: 2020-12-02 | Disposition: A | Discharge status: Home / Self Care | Payer: BC Managed Care – PPO | Source: Ambulatory Visit | Attending: Urology | Admitting: Urology

## 2020-12-02 ENCOUNTER — Other Ambulatory Visit: Payer: Self-pay

## 2020-12-02 ENCOUNTER — Other Ambulatory Visit (HOSPITAL_COMMUNITY)
Admission: RE | Admit: 2020-12-02 | Discharge: 2020-12-02 | Disposition: A | Discharge status: Home / Self Care | Payer: BC Managed Care – PPO | Source: Ambulatory Visit | Attending: Urology | Admitting: Urology

## 2020-12-02 ENCOUNTER — Encounter (HOSPITAL_COMMUNITY): Payer: Self-pay

## 2020-12-02 DIAGNOSIS — Z01812 Encounter for preprocedural laboratory examination: Secondary | ICD-10-CM | POA: Insufficient documentation

## 2020-12-02 DIAGNOSIS — Z20822 Contact with and (suspected) exposure to covid-19: Secondary | ICD-10-CM | POA: Insufficient documentation

## 2020-12-02 HISTORY — DX: Cardiac arrhythmia, unspecified: I49.9

## 2020-12-02 HISTORY — DX: Sleep apnea, unspecified: G47.30

## 2020-12-02 LAB — CBC
HCT: 42.7 % (ref 39.0–52.0)
Hemoglobin: 14.8 g/dL (ref 13.0–17.0)
MCH: 30.4 pg (ref 26.0–34.0)
MCHC: 34.7 g/dL (ref 30.0–36.0)
MCV: 87.7 fL (ref 80.0–100.0)
Platelets: 213 10*3/uL (ref 150–400)
RBC: 4.87 MIL/uL (ref 4.22–5.81)
RDW: 12.3 % (ref 11.5–15.5)
WBC: 5.5 10*3/uL (ref 4.0–10.5)
nRBC: 0 % (ref 0.0–0.2)

## 2020-12-02 LAB — BASIC METABOLIC PANEL
Anion gap: 5 (ref 5–15)
BUN: 15 mg/dL (ref 6–20)
CO2: 28 mmol/L (ref 22–32)
Calcium: 9 mg/dL (ref 8.9–10.3)
Chloride: 106 mmol/L (ref 98–111)
Creatinine, Ser: 1.01 mg/dL (ref 0.61–1.24)
GFR, Estimated: >60 mL/min (ref >60)
Glucose, Bld: 108 mg/dL — ABNORMAL HIGH (ref 70–99)
Potassium: 4.1 mmol/L (ref 3.5–5.1)
Sodium: 139 mmol/L (ref 135–145)

## 2020-12-02 LAB — SARS CORONAVIRUS 2 (TAT 6-24 HRS): SARS Coronavirus 2: NEGATIVE

## 2020-12-02 NOTE — Progress Notes (Addendum)
Anesthesia Review:  PCP: DR Collier Salina in [facility] and requested LOV note.  They are to fax. 02/05/2020 LOv note on chart.  Cardiologist : in [place] had followup visit post ablation and no followup since  Ablaton 07/30/2019 - Care Everywhere  Chest x-ray : EKG : 07/30/2019  EKG placed on chart from [facility] dated 08/31/2020  Echo : Stress test: Cardiac Cath :  Activity level: can doa  flgiht of stair swithout difficulty  Sleep Study/ CPAP : has cpap  Fasting Blood Sugar :      / Checks Blood Sugar -- times a day:   Blood Thinner/ Instructions /Last Dose: ASA / Instructions/ Last Dose :  Pt reports at preop that  He is in Research Trial at [facility] and they did ekg on him working up for surgery and some trial med. Spoke with Bethena Roys in Research at office and ekg was done .  Bethena Roys will have to speak with DR Alinda Money since he is PI of Study and see if copy of ekg can be faxed to [place].  Bethena Roys will be back in touch.

## 2020-12-03 NOTE — H&P (Signed)
Office Visit Report     11/23/2020   --------------------------------------------------------------------------------   Gordon Manges. Scott  MRN: [id number]  DOB: [birth year], 55 year old Male  SSN:    PRIMARY CARE:  Marlena Clipper, NP  REFERRING:  Rudell Cobb. Marin Olp, MD  PROVIDER:  Raynelle Bring, M.D.  LOCATION:  [facility] [phone]     --------------------------------------------------------------------------------   CC/HPI: CC: Prostate Cancer   Gordon Scott is a 55 year old gentleman who was found to have a PSA of 123 on routine screening. This was repeated and remained elevated at 126. This prompted a TRUS biopsy of the prostate on 03/01/20 by Dr. Joie Bimler that confirmed Gleason 5+4=9 adenocarcinoma in 12 out of 12 biopsy cores. He was evaluated by Dr. Marin Olp in Oncology and was then referred to me to discuss options for treatment. He has noted progressive lower urinary tract symptoms over the past few months a including increased nocturia along with increased frequency, weak stream, and straining to urinate. His staging studies with bone scan and CT scan ([employer] denied an attempt for PSMA PET scan) were negative. He elected to enroll in the PROTEUS study with ADT plus placebo vs apalutamide for 6 months of neoadjuvant and adjuvant systemic therapy around radical prostatectomy for primary treatment. He has now completed the initial 6 months of systemic therapy and presents in preparation for radical prostatectomy later this month.   Family history: None.   Imaging studies:  Bone scan (03/24/20): Negative for metastatic disease.  CT scan (03/24/20): Negative for metastatic disease.   PMH: He has a history of SVT s/p ablation.  PSH: No abdominal surgeries.   TNM stage: cT3a N0 M0  PSA: 126  Gleason score: 5+4=9  Biopsy (03/01/20): 12/12 cores positive  Left: L lateral apex (90%, 5+4=9, PNI), L apex (80%, 5+4=9, PNI), L lateral mid (95%, 5+4=9, EPE, PNI), L mid (95%,  5+4=9), L lateral base (95%, 5+4=9, PNI), L base (90%, 5+4=9, EPE, PNI)  Right: R apex (90%, 5+4=9, EPE, PNI), R lateral apex (95%, 5+4=9, EPE, PNI), R mid (95%, 5+4=9, PNI, EPE), R lateral mid (90%, 5+4=9, PNI), R base (95%, 5+4=9, EPE, PNI), R lateral base (90%, 5+4=9, EPE, PNI)  Prostate volume: 56.5 cc    Urinary function: IPSS is 21.  Erectile function: SHIM score is 24.     ALLERGIES: NKDA    MEDICATIONS: Flomax 0.4 mg capsule  Juice Plus     GU PSH: No GU PSH    NON-GU PSH: Additional Spinal Fusion     GU PMH: Stress Incontinence - 11/01/2020 Prostate Cancer - 10/27/2020, - 10/14/2020, - 09/28/2020, - 08/31/2020, - 07/05/2020, - 05/12/2020, - 04/06/2020 Left testicular pain - 06/02/2020      PMH Notes:   1) Prostate cancer: He was diagnosed with very high risk prostate cancer in August 2021 with a PSA of 126 and 12 out of 12 biopsy cores positive for Gleason 4+5=9 adenocarcinoma. Conventional imaging studies were negative. His insurance company denied him the option of a PSMA PET scan. He eventually elected surgical therapy in the context of the PROTEUS study. He began systemic androgen directed therapy in October 2021. He proceeded with surgical therapy with a NNS RAL radical prostatectomy and BPLND and umbilical hernia repair in May 2022.   Diagnosis:  Pretreatment PSA: 126  Pretreatment SHIM score: 24      NON-GU PMH: Muscle weakness (generalized) - 11/01/2020, - 10/14/2020 Other muscle spasm - 11/01/2020    FAMILY  HISTORY: 1 Daughter - Daughter 4 sons - Son   SOCIAL HISTORY: Marital Status: Married Preferred Language: English; Ethnicity: Not Hispanic Or Latino; Race: White Current Smoking Status: Patient has never smoked.   Tobacco Use Assessment Completed: Used Tobacco in last 30 days? Social Drinker.  Drinks 3 caffeinated drinks per day.    REVIEW OF SYSTEMS:    GU Review Male:   Patient denies frequent urination, hard to postpone urination, burning/ pain  with urination, get up at night to urinate, leakage of urine, stream starts and stops, trouble starting your streams, and have to strain to urinate .  Gastrointestinal (Upper):   Patient denies nausea and vomiting.  Gastrointestinal (Lower):   Patient denies diarrhea and constipation.  Constitutional:   Patient denies fever, night sweats, weight loss, and fatigue.  Skin:   Patient denies skin rash/ lesion and itching.  Eyes:   Patient denies blurred vision and double vision.  Ears/ Nose/ Throat:   Patient denies sore throat and sinus problems.  Hematologic/Lymphatic:   Patient denies swollen glands and easy bruising.  Cardiovascular:   Patient denies leg swelling and chest pains.  Respiratory:   Patient denies cough and shortness of breath.  Endocrine:   Patient denies excessive thirst.  Musculoskeletal:   Patient denies back pain and joint pain.  Neurological:   Patient denies headaches and dizziness.  Psychologic:   Patient denies depression and anxiety.   VITAL SIGNS:      11/23/2020 08:05 AM  Weight 205 lb / 92.99 kg  Height 67 in / 170.18 cm  BP 112/64 mmHg  Pulse 55 /min  Temperature 97.1 F / 36.1 C  BMI 32.1 kg/m   GU PHYSICAL EXAMINATION:    Prostate: Prostate about 60 grams. Increased firmness of the right side of prostate compared to the left with possible extraprostatic extension.   MULTI-SYSTEM PHYSICAL EXAMINATION:    Constitutional: Well-nourished. No physical deformities. Normally developed. Good grooming.  Respiratory: No labored breathing, no use of accessory muscles. Clear bilaterally.  Cardiovascular: Normal temperature, normal extremity pulses, no swelling, no varicosities. Regular rate and rhythm.  Gastrointestinal: No mass, no tenderness, no rigidity, non obese abdomen. Large reducible umbilical hernia.     Complexity of Data:  Lab Test Review:   PSA  Records Review:   Pathology Reports, Previous Patient Records   08/24/20  Hormones  Testosterone, Total  708.3 ng/dL   Notes:                     a   PROCEDURES:          Urinalysis Dipstick Dipstick Cont'd  Color: Yellow Bilirubin: Neg mg/dL  Appearance: Clear Ketones: Neg mg/dL  Specific Gravity: 1.020 Blood: Neg ery/uL  pH: <=5.0 Protein: Neg mg/dL  Glucose: Neg mg/dL Urobilinogen: 0.2 mg/dL    Nitrites: Neg    Leukocyte Esterase: Neg leu/uL    ASSESSMENT:      ICD-10 Details  1 GU:   Prostate Cancer - C61    PLAN:           Schedule Return Visit/Planned Activity: Keep Scheduled Appointment          Document Letter(s):  Created for Patient: Clinical Summary         Notes:   1. Locally advanced, very high risk prostate cancer: He has now completed 6 months of neoadjuvant treatment with androgen deprivation + apalutamide versus placebo and is prepared to proceed with surgical treatment with a robotic prostatectomy  and bilateral pelvic lymphadenectomy per protocol on the PROTEUS trial. We reviewed our goals of treatment related to cancer control, urinary function, and sexual function discussed have surgical treatment likely would impact each of these goals. He understands that we would proceed with a wide non nerve sparing prostatectomy that would render the ability for spontaneous erections to be very minimal. He accepts this risk and understands the trade off with our primary goal of providing him the best possible cancer operation.   We discussed surgical therapy for prostate cancer including the different available surgical approaches. We discussed, in detail, the risks and expectations of surgery with regard to cancer control, urinary control, and erectile function as well as the expected postoperative recovery process. Additional risks of surgery including but not limited to bleeding, infection, hernia formation, nerve damage, lymphocele formation, bowel/rectal injury potentially necessitating colostomy, damage to the urinary tract resulting in urine leakage, urethral stricture,  and the cardiopulmonary risks such as myocardial infarction, stroke, death, venothromboembolism, etc. were explained. The risk of open surgical conversion for robotic/laparoscopic prostatectomy was also discussed.   He is prepared to proceed. He has been off study medication for the past 2 weeks per protocol. He is scheduled for a non nerve-sparing robot assisted laparoscopic radical prostatectomy and bilateral pelvic lymphadenectomy on 63/87/5643.    2. Umbilical hernia: We will plan to proceed with primary repair of his umbilical hernia at the time of his upcoming procedure. He does understand the increased risk of recurrence with primary repair vs. mesh repair but understands the concern about utilizing mesh during his prostatectomy with there will be urine in the field. Finally, we discussed the importance of adhering to his postoperative recommendations and avoiding any significant lifting, straining, or increased physical activity to avoid the risk of recurrence.   Cc: Collier Salina, NP  Dr. Burney Gauze          Next Appointment:      Next Appointment: 12/06/2020 07:15 AM    Appointment Type: Surgery     Location: [facility] [phone]    Provider: Raynelle Bring, M.D.    Reason for Visit: WL/OP RA LAP RAD PROSTATECTOMY, BPLA , UMBILICAL HERNIA REPAIR      E & M CODES: We spent 58 minutes dedicated to evaluation and management time, including face to face interaction, discussions on coordination of care, documentation, result review, and discussion with others as applicable.     * Signed by Raynelle Bring, M.D. on 11/23/20 at 2:17 PM (EDT)*

## 2020-12-06 ENCOUNTER — Observation Stay (HOSPITAL_COMMUNITY)
Admission: RE | Admit: 2020-12-06 | Discharge: 2020-12-07 | Disposition: A | Discharge status: Home / Self Care | Payer: BC Managed Care – PPO | Attending: Urology | Admitting: Urology

## 2020-12-06 ENCOUNTER — Ambulatory Visit (HOSPITAL_COMMUNITY): Payer: BC Managed Care – PPO | Admitting: Physician Assistant

## 2020-12-06 ENCOUNTER — Ambulatory Visit (HOSPITAL_COMMUNITY): Payer: BC Managed Care – PPO | Admitting: Certified Registered"

## 2020-12-06 ENCOUNTER — Encounter (HOSPITAL_COMMUNITY)
Admission: RE | Disposition: A | Discharge status: Home / Self Care | Payer: Self-pay | Source: Home / Self Care | Attending: Urology

## 2020-12-06 ENCOUNTER — Encounter (HOSPITAL_COMMUNITY): Payer: Self-pay | Admitting: Urology

## 2020-12-06 DIAGNOSIS — K429 Umbilical hernia without obstruction or gangrene: Secondary | ICD-10-CM | POA: Insufficient documentation

## 2020-12-06 DIAGNOSIS — C61 Malignant neoplasm of prostate: Principal | ICD-10-CM | POA: Insufficient documentation

## 2020-12-06 DIAGNOSIS — N429 Disorder of prostate, unspecified: Secondary | ICD-10-CM | POA: Diagnosis not present

## 2020-12-06 DIAGNOSIS — Z9989 Dependence on other enabling machines and devices: Secondary | ICD-10-CM | POA: Diagnosis not present

## 2020-12-06 DIAGNOSIS — G4733 Obstructive sleep apnea (adult) (pediatric): Secondary | ICD-10-CM | POA: Diagnosis not present

## 2020-12-06 HISTORY — PX: ROBOT ASSISTED LAPAROSCOPIC RADICAL PROSTATECTOMY: SHX5141

## 2020-12-06 HISTORY — PX: LYMPHADENECTOMY: SHX5960

## 2020-12-06 LAB — ABO/RH: ABO/RH(D): O POS

## 2020-12-06 LAB — TYPE AND SCREEN
ABO/RH(D): O POS
Antibody Screen: NEGATIVE

## 2020-12-06 LAB — HEMOGLOBIN AND HEMATOCRIT, BLOOD
HCT: 39.9 % (ref 39.0–52.0)
Hemoglobin: 13.8 g/dL (ref 13.0–17.0)

## 2020-12-06 SURGERY — XI ROBOTIC ASSISTED LAPAROSCOPIC RADICAL PROSTATECTOMY LEVEL 2
Anesthesia: General

## 2020-12-06 MED ORDER — HEPARIN SODIUM (PORCINE) 1000 UNIT/ML IJ SOLN
INTRAMUSCULAR | Status: AC
  Filled 2020-12-06: qty 1

## 2020-12-06 MED ORDER — BUPIVACAINE-EPINEPHRINE 0.25% -1:200000 IJ SOLN
INTRAMUSCULAR | Status: DC | PRN
  Administered 2020-12-06: 30 mL

## 2020-12-06 MED ORDER — DIPHENHYDRAMINE HCL 12.5 MG/5ML PO ELIX: 12.5000 mg | ORAL_SOLUTION | Freq: Four times a day (QID) | ORAL | Status: DC | PRN

## 2020-12-06 MED ORDER — ROCURONIUM BROMIDE 10 MG/ML (PF) SYRINGE
PREFILLED_SYRINGE | INTRAVENOUS | Status: AC
  Filled 2020-12-06: qty 10

## 2020-12-06 MED ORDER — CEFAZOLIN SODIUM-DEXTROSE 2-4 GM/100ML-% IV SOLN
2.0000 g | Freq: Once | INTRAVENOUS | Status: AC
  Administered 2020-12-06: 2 g via INTRAVENOUS
  Filled 2020-12-06: qty 100

## 2020-12-06 MED ORDER — EPHEDRINE SULFATE-NACL 50-0.9 MG/10ML-% IV SOSY
PREFILLED_SYRINGE | INTRAVENOUS | Status: DC | PRN
  Administered 2020-12-06: 5 mg via INTRAVENOUS

## 2020-12-06 MED ORDER — FENTANYL CITRATE (PF) 100 MCG/2ML IJ SOLN
INTRAMUSCULAR | Status: AC
  Filled 2020-12-06: qty 2

## 2020-12-06 MED ORDER — SUGAMMADEX SODIUM 200 MG/2ML IV SOLN
INTRAVENOUS | Status: DC | PRN
  Administered 2020-12-06: 200 mg via INTRAVENOUS

## 2020-12-06 MED ORDER — HYDROMORPHONE HCL 1 MG/ML IJ SOLN
0.5000 mg | INTRAMUSCULAR | Status: AC | PRN
  Administered 2020-12-06: 0.5 mg via INTRAVENOUS

## 2020-12-06 MED ORDER — EPHEDRINE 5 MG/ML INJ
INTRAVENOUS | Status: AC
  Filled 2020-12-06: qty 10

## 2020-12-06 MED ORDER — ONDANSETRON HCL 4 MG/2ML IJ SOLN: 4.0000 mg | INTRAMUSCULAR | Status: DC | PRN

## 2020-12-06 MED ORDER — ACETAMINOPHEN 10 MG/ML IV SOLN
1000.0000 mg | Freq: Once | INTRAVENOUS | Status: DC | PRN
  Administered 2020-12-06: 1000 mg via INTRAVENOUS

## 2020-12-06 MED ORDER — PROMETHAZINE HCL 25 MG/ML IJ SOLN: 6.2500 mg | INTRAMUSCULAR | Status: DC | PRN

## 2020-12-06 MED ORDER — ROCURONIUM BROMIDE 10 MG/ML (PF) SYRINGE
PREFILLED_SYRINGE | INTRAVENOUS | Status: DC | PRN
  Administered 2020-12-06: 20 mg via INTRAVENOUS
  Administered 2020-12-06: 5 mg via INTRAVENOUS
  Administered 2020-12-06: 10 mg via INTRAVENOUS
  Administered 2020-12-06: 70 mg via INTRAVENOUS

## 2020-12-06 MED ORDER — CEFAZOLIN SODIUM-DEXTROSE 1-4 GM/50ML-% IV SOLN
1.0000 g | Freq: Three times a day (TID) | INTRAVENOUS | Status: AC
  Administered 2020-12-06 – 2020-12-07 (×2): 1 g via INTRAVENOUS
  Filled 2020-12-06 (×2): qty 50

## 2020-12-06 MED ORDER — LIDOCAINE 2% (20 MG/ML) 5 ML SYRINGE
INTRAMUSCULAR | Status: AC
  Filled 2020-12-06: qty 5

## 2020-12-06 MED ORDER — LACTATED RINGERS IV SOLN: INTRAVENOUS | Status: DC | PRN

## 2020-12-06 MED ORDER — KCL IN DEXTROSE-NACL 20-5-0.45 MEQ/L-%-% IV SOLN
INTRAVENOUS | Status: DC
  Filled 2020-12-06 (×3): qty 1000

## 2020-12-06 MED ORDER — SULFAMETHOXAZOLE-TRIMETHOPRIM 800-160 MG PO TABS: 1.0000 | ORAL_TABLET | Freq: Two times a day (BID) | ORAL | 0 refills | Status: DC

## 2020-12-06 MED ORDER — DEXAMETHASONE SODIUM PHOSPHATE 10 MG/ML IJ SOLN
INTRAMUSCULAR | Status: DC | PRN
  Administered 2020-12-06: 8 mg via INTRAVENOUS

## 2020-12-06 MED ORDER — PROPOFOL 10 MG/ML IV BOLUS
INTRAVENOUS | Status: AC
  Filled 2020-12-06: qty 40

## 2020-12-06 MED ORDER — KETOROLAC TROMETHAMINE 15 MG/ML IJ SOLN
INTRAMUSCULAR | Status: AC
  Administered 2020-12-06: 15 mg via INTRAVENOUS
  Filled 2020-12-06: qty 1

## 2020-12-06 MED ORDER — DIPHENHYDRAMINE HCL 50 MG/ML IJ SOLN: 12.5000 mg | Freq: Four times a day (QID) | INTRAMUSCULAR | Status: DC | PRN

## 2020-12-06 MED ORDER — SODIUM CHLORIDE 0.9 % IV BOLUS
1000.0000 mL | Freq: Once | INTRAVENOUS | Status: AC
  Administered 2020-12-06: 1000 mL via INTRAVENOUS

## 2020-12-06 MED ORDER — ZOLPIDEM TARTRATE 5 MG PO TABS: 5.0000 mg | ORAL_TABLET | Freq: Every evening | ORAL | Status: DC | PRN

## 2020-12-06 MED ORDER — PROPOFOL 10 MG/ML IV BOLUS
INTRAVENOUS | Status: DC | PRN
  Administered 2020-12-06: 200 mg via INTRAVENOUS

## 2020-12-06 MED ORDER — ACETAMINOPHEN 10 MG/ML IV SOLN
INTRAVENOUS | Status: AC
  Filled 2020-12-06: qty 100

## 2020-12-06 MED ORDER — KETOROLAC TROMETHAMINE 15 MG/ML IJ SOLN
15.0000 mg | Freq: Four times a day (QID) | INTRAMUSCULAR | Status: DC
  Administered 2020-12-06 – 2020-12-07 (×3): 15 mg via INTRAVENOUS
  Filled 2020-12-06 (×3): qty 1

## 2020-12-06 MED ORDER — MAGNESIUM CITRATE PO SOLN: 1.0000 | Freq: Once | ORAL | Status: DC

## 2020-12-06 MED ORDER — MORPHINE SULFATE (PF) 2 MG/ML IV SOLN
2.0000 mg | INTRAVENOUS | Status: DC | PRN
  Administered 2020-12-06 – 2020-12-07 (×4): 2 mg via INTRAVENOUS
  Filled 2020-12-06 (×4): qty 1

## 2020-12-06 MED ORDER — BACITRACIN-NEOMYCIN-POLYMYXIN 400-5-5000 EX OINT: 1.0000 "application " | TOPICAL_OINTMENT | Freq: Three times a day (TID) | CUTANEOUS | Status: DC | PRN

## 2020-12-06 MED ORDER — TRAMADOL HCL 50 MG PO TABS: 50.0000 mg | ORAL_TABLET | Freq: Four times a day (QID) | ORAL | 0 refills | Status: DC | PRN

## 2020-12-06 MED ORDER — SODIUM CHLORIDE 0.9 % IR SOLN
Status: DC | PRN
  Administered 2020-12-06: 1000 mL

## 2020-12-06 MED ORDER — BUPIVACAINE-EPINEPHRINE (PF) 0.25% -1:200000 IJ SOLN
INTRAMUSCULAR | Status: AC
  Filled 2020-12-06: qty 30

## 2020-12-06 MED ORDER — BELLADONNA ALKALOIDS-OPIUM 16.2-60 MG RE SUPP
1.0000 | Freq: Four times a day (QID) | RECTAL | Status: DC | PRN
Start: 2020-12-06 — End: 2020-12-07

## 2020-12-06 MED ORDER — OXYCODONE HCL 5 MG PO TABS
5.0000 mg | ORAL_TABLET | ORAL | Status: DC | PRN
  Administered 2020-12-06: 5 mg via ORAL

## 2020-12-06 MED ORDER — MIDAZOLAM HCL 2 MG/2ML IJ SOLN
INTRAMUSCULAR | Status: DC | PRN
  Administered 2020-12-06: 2 mg via INTRAVENOUS

## 2020-12-06 MED ORDER — FLEET ENEMA 7-19 GM/118ML RE ENEM: 1.0000 | ENEMA | Freq: Once | RECTAL | Status: DC

## 2020-12-06 MED ORDER — DOCUSATE SODIUM 100 MG PO CAPS
100.0000 mg | ORAL_CAPSULE | Freq: Two times a day (BID) | ORAL | Status: DC
  Administered 2020-12-06 – 2020-12-07 (×2): 100 mg via ORAL
  Filled 2020-12-06 (×2): qty 1

## 2020-12-06 MED ORDER — OXYCODONE HCL 5 MG PO TABS
ORAL_TABLET | ORAL | Status: AC
  Filled 2020-12-06: qty 1

## 2020-12-06 MED ORDER — MIDAZOLAM HCL 2 MG/2ML IJ SOLN
INTRAMUSCULAR | Status: AC
  Filled 2020-12-06: qty 2

## 2020-12-06 MED ORDER — HYDROMORPHONE HCL 1 MG/ML IJ SOLN
INTRAMUSCULAR | Status: AC
  Administered 2020-12-06: 0.5 mg via INTRAVENOUS
  Filled 2020-12-06: qty 1

## 2020-12-06 MED ORDER — DEXAMETHASONE SODIUM PHOSPHATE 10 MG/ML IJ SOLN
INTRAMUSCULAR | Status: AC
  Filled 2020-12-06: qty 1

## 2020-12-06 MED ORDER — FENTANYL CITRATE (PF) 250 MCG/5ML IJ SOLN
INTRAMUSCULAR | Status: DC | PRN
  Administered 2020-12-06 (×2): 50 ug via INTRAVENOUS
  Administered 2020-12-06: 25 ug via INTRAVENOUS
  Administered 2020-12-06: 50 ug via INTRAVENOUS
  Administered 2020-12-06: 100 ug via INTRAVENOUS
  Administered 2020-12-06: 25 ug via INTRAVENOUS

## 2020-12-06 MED ORDER — LACTATED RINGERS IV SOLN: INTRAVENOUS | Status: DC

## 2020-12-06 MED ORDER — HEPARIN SODIUM (PORCINE) 5000 UNIT/ML IJ SOLN
5000.0000 [IU] | Freq: Three times a day (TID) | INTRAMUSCULAR | Status: DC
  Administered 2020-12-06 – 2020-12-07 (×2): 5000 [IU] via SUBCUTANEOUS
  Filled 2020-12-06 (×2): qty 1

## 2020-12-06 MED ORDER — FENTANYL CITRATE (PF) 100 MCG/2ML IJ SOLN
25.0000 ug | INTRAMUSCULAR | Status: DC | PRN
  Administered 2020-12-06 (×3): 50 ug via INTRAVENOUS

## 2020-12-06 MED ORDER — ACETAMINOPHEN 325 MG PO TABS
650.0000 mg | ORAL_TABLET | ORAL | Status: DC | PRN
  Administered 2020-12-07: 650 mg via ORAL
  Filled 2020-12-06: qty 2

## 2020-12-06 MED ORDER — LIDOCAINE 2% (20 MG/ML) 5 ML SYRINGE
INTRAMUSCULAR | Status: DC | PRN
  Administered 2020-12-06: 80 mg via INTRAVENOUS

## 2020-12-06 MED ORDER — ONDANSETRON HCL 4 MG/2ML IJ SOLN
INTRAMUSCULAR | Status: AC
  Filled 2020-12-06: qty 2

## 2020-12-06 MED ORDER — ONDANSETRON HCL 4 MG/2ML IJ SOLN
INTRAMUSCULAR | Status: DC | PRN
  Administered 2020-12-06: 4 mg via INTRAVENOUS

## 2020-12-06 SURGICAL SUPPLY — 59 items
APPLICATOR COTTON TIP 6 STRL (MISCELLANEOUS) ×2 IMPLANT
APPLICATOR COTTON TIP 6IN STRL (MISCELLANEOUS) ×3
CATH FOLEY 2WAY SLVR 18FR 30CC (CATHETERS) ×3 IMPLANT
CATH ROBINSON RED A/P 16FR (CATHETERS) ×3 IMPLANT
CATH ROBINSON RED A/P 8FR (CATHETERS) ×3 IMPLANT
CATH TIEMANN FOLEY 18FR 5CC (CATHETERS) ×3 IMPLANT
CHLORAPREP W/TINT 26 (MISCELLANEOUS) ×3 IMPLANT
CLIP VESOLOCK LG 6/CT PURPLE (CLIP) ×6 IMPLANT
COVER SURGICAL LIGHT HANDLE (MISCELLANEOUS) ×3 IMPLANT
COVER TIP SHEARS 8 DVNC (MISCELLANEOUS) ×2 IMPLANT
COVER TIP SHEARS 8MM DA VINCI (MISCELLANEOUS) ×1
COVER WAND RF STERILE (DRAPES) IMPLANT
CUTTER ECHEON FLEX ENDO 45 340 (ENDOMECHANICALS) ×3 IMPLANT
DECANTER SPIKE VIAL GLASS SM (MISCELLANEOUS) ×3 IMPLANT
DERMABOND ADVANCED (GAUZE/BANDAGES/DRESSINGS) ×1
DERMABOND ADVANCED .7 DNX12 (GAUZE/BANDAGES/DRESSINGS) ×2 IMPLANT
DRAIN CHANNEL RND F F (WOUND CARE) IMPLANT
DRAPE ARM DVNC X/XI (DISPOSABLE) ×8 IMPLANT
DRAPE COLUMN DVNC XI (DISPOSABLE) ×2 IMPLANT
DRAPE DA VINCI XI ARM (DISPOSABLE) ×4
DRAPE DA VINCI XI COLUMN (DISPOSABLE) ×1
DRAPE SURG IRRIG POUCH 19X23 (DRAPES) ×3 IMPLANT
DRSG TEGADERM 4X4.75 (GAUZE/BANDAGES/DRESSINGS) ×3 IMPLANT
ELECT PENCIL ROCKER SW 15FT (MISCELLANEOUS) ×3 IMPLANT
ELECT REM PT RETURN 15FT ADLT (MISCELLANEOUS) ×3 IMPLANT
GLOVE SURG ENC MOIS LTX SZ6.5 (GLOVE) ×6 IMPLANT
GLOVE SURG ENC TEXT LTX SZ7.5 (GLOVE) ×9 IMPLANT
GOWN STRL REUS W/TWL LRG LVL3 (GOWN DISPOSABLE) ×12 IMPLANT
HOLDER FOLEY CATH W/STRAP (MISCELLANEOUS) ×3 IMPLANT
IRRIG SUCT STRYKERFLOW 2 WTIP (MISCELLANEOUS) ×3
IRRIGATION SUCT STRKRFLW 2 WTP (MISCELLANEOUS) ×2 IMPLANT
IV LACTATED RINGERS 1000ML (IV SOLUTION) ×3 IMPLANT
KIT TURNOVER KIT A (KITS) ×3 IMPLANT
NDL SAFETY ECLIPSE 18X1.5 (NEEDLE) ×2 IMPLANT
NEEDLE HYPO 18GX1.5 SHARP (NEEDLE) ×1
PACK ROBOT UROLOGY CUSTOM (CUSTOM PROCEDURE TRAY) ×3 IMPLANT
PENCIL SMOKE EVACUATOR (MISCELLANEOUS) IMPLANT
SEAL CANN UNIV 5-8 DVNC XI (MISCELLANEOUS) ×8 IMPLANT
SEAL XI 5MM-8MM UNIVERSAL (MISCELLANEOUS) ×4
SET TUBE SMOKE EVAC HIGH FLOW (TUBING) ×3 IMPLANT
SOLUTION ELECTROLUBE (MISCELLANEOUS) ×3 IMPLANT
STAPLE RELOAD 45 GRN (STAPLE) ×2 IMPLANT
STAPLE RELOAD 45MM GREEN (STAPLE) ×1
SUT ETHILON 3 0 PS 1 (SUTURE) ×3 IMPLANT
SUT MNCRL 3 0 RB1 (SUTURE) ×2 IMPLANT
SUT MNCRL 3 0 VIOLET RB1 (SUTURE) ×2 IMPLANT
SUT MNCRL AB 4-0 PS2 18 (SUTURE) ×6 IMPLANT
SUT MONOCRYL 3 0 RB1 (SUTURE) ×2
SUT NOVA NAB GS-21 0 18 T12 DT (SUTURE) ×6 IMPLANT
SUT VIC AB 0 CT1 27 (SUTURE) ×1
SUT VIC AB 0 CT1 27XBRD ANTBC (SUTURE) ×2 IMPLANT
SUT VIC AB 0 UR5 27 (SUTURE) ×3 IMPLANT
SUT VIC AB 2-0 SH 27 (SUTURE) ×1
SUT VIC AB 2-0 SH 27X BRD (SUTURE) ×2 IMPLANT
SUT VICRYL 0 UR6 27IN ABS (SUTURE) ×6 IMPLANT
SYR 27GX1/2 1ML LL SAFETY (SYRINGE) ×3 IMPLANT
TOWEL OR NON WOVEN STRL DISP B (DISPOSABLE) ×3 IMPLANT
TROCAR XCEL NON-BLD 5MMX100MML (ENDOMECHANICALS) IMPLANT
WATER STERILE IRR 1000ML POUR (IV SOLUTION) ×3 IMPLANT

## 2020-12-06 NOTE — Op Note (Signed)
Preoperative diagnosis: Clinically localized adenocarcinoma of the prostate (clinical stage Z6X N0 M0), umbilical hernia  Postoperative diagnosis: Clinically localized adenocarcinoma of the prostate (clinical stage W9U N0 M0), umbilical hernia  Procedure:  1. Robotic assisted laparoscopic radical prostatectomy (non nerve sparing) 2. Bilateral robotic assisted laparoscopic pelvic lymphadenectomy 3. Repair of umbilical hernia (2.5 cm defect)  Surgeon: Roxy Horseman, Brooke Bonito. M.D.  Assistant(s): Dr. Rexene Alberts  Anesthesia: General  Complications: None  EBL: 200 mL  IVF:  2800 mL crystalloid  Specimens: 1. Prostate and seminal vesicles 2. Right pelvic lymph nodes 3. Left pelvic lymph nodes  Disposition of specimens: Pathology  Drains: 1. 20 Fr coude catheter 2. # 48 Blake pelvic drain  Indication: Gordon Scott is a 55 y.o. year old patient with high risk, locally advanced prostate cancer.  After a thorough review of the management options for treatment of prostate cancer, he elected to proceed with surgical therapy and the above procedure(s).  We have discussed the potential benefits and risks of the procedure, side effects of the proposed treatment, the likelihood of the patient achieving the goals of the procedure, and any potential problems that might occur during the procedure or recuperation. Informed consent has been obtained.  Description of procedure:  The patient was taken to the operating room and a general anesthetic was administered. He was given preoperative antibiotics, placed in the dorsal lithotomy position, and prepped and draped in the usual sterile fashion. Next a preoperative timeout was performed. A urethral catheter was placed into the bladder and a site was selected just superior umbilicus for placement of the camera port.  The patients known umbilical hernia was identified. This was placed using a standard open Hassan technique which allowed entry into the  peritoneal cavity under direct vision and without difficulty. A 12 mm port was placed and a pneumoperitoneum established. The camera was then used to inspect the abdomen and there was no evidence of any intra-abdominal injuries or other abnormalities. The remaining abdominal ports were then placed. 8 mm robotic ports were placed in the right lower quadrant, left lower quadrant, and far left lateral abdominal wall. A 5 mm port was placed in the right upper quadrant and a 12 mm port was placed in the right lateral abdominal wall for laparoscopic assistance. All ports were placed under direct vision without difficulty. The surgical cart was then docked.   Utilizing the cautery scissors, the bladder was reflected posteriorly allowing entry into the space of Retzius and identification of the endopelvic fascia and prostate. The periprostatic fat was then removed from the prostate allowing full exposure of the endopelvic fascia. The endopelvic fascia was then incised from the apex back to the base of the prostate bilaterally and the underlying levator muscle fibers were swept laterally off the prostate thereby isolating the dorsal venous complex. The dorsal vein was then stapled and divided with a 45 mm Flex Echelon stapler. Attention then turned to the bladder neck which was divided anteriorly thereby allowing entry into the bladder and exposure of the urethral catheter. The catheter balloon was deflated and the catheter was brought into the operative field and used to retract the prostate anteriorly. The posterior bladder neck was then examined and was divided allowing further dissection between the bladder and prostate posteriorly until the vasa deferentia and seminal vessels were identified. The vasa deferentia were isolated, divided, and lifted anteriorly. The seminal vesicles were dissected down to their tips with care to control the seminal vascular arterial blood  supply. These structures were then lifted  anteriorly and the space between Denonvillier's fascia and the anterior rectum was developed with a combination of sharp and blunt dissection. This isolated the vascular pedicles of the prostate.  A wide non nerve sparing dissection was performed with Weck clips used to ligate the vascular pedicles of the prostate bilaterally. The vascular pedicles of the prostate were then divided.  The urethra was then sharply transected allowing the prostate specimen to be disarticulated. The pelvis was copiously irrigated and hemostasis was ensured. There was no evidence for rectal injury.  Attention then turned to the right pelvic sidewall. The fibrofatty tissue between the external iliac vein, confluence of the iliac vessels, hypogastric artery, and Cooper's ligament was dissected free from the pelvic sidewall with care to preserve the obturator nerve. Weck clips were used for lymphostasis and hemostasis. An identical procedure was performed on the contralateral side and the lymphatic packets were removed for permanent pathologic analysis.  Attention then turned to the urethral anastomosis. A 2-0 Vicryl slip knot was placed between Denonvillier's fascia, the posterior bladder neck, and the posterior urethra to reapproximate these structures. A double-armed 3-0 Monocryl suture was then used to perform a 360 running tension-free anastomosis between the bladder neck and urethra. A new urethral catheter was then placed into the bladder and irrigated. There were no blood clots within the bladder and the anastomosis appeared to be watertight. A #19 Blake drain was then brought through the left lateral 8 mm port site and positioned appropriately within the pelvis. It was secured to the skin with a nylon suture. The surgical cart was then undocked. The right lateral 12 mm port site was closed at the fascial level with a 0 Vicryl suture placed laparoscopically. All remaining ports were then removed under direct vision. The  prostate specimen was removed intact within the Endopouch retrieval bag via the periumbilical camera port site which was extended around the umbilicus.  The hernia defect was identified and the fascial incision was carried down to the defect to include it.  The fatty tissue within the hernia sac was removed with cautery.  Figure of eight 0-Novofil sutures were then used to close the fascia primarily along this incision allowing closure of the hernia. 0.25% Marcaine was then injected into all port sites and all incisions were reapproximated at the skin level with 3-0 Monocryl subcuticular sutures. Dermabond was applied to the skin. The patient appeared to tolerate the procedure well and without complications. The patient was able to be extubated and transferred to the recovery unit in satisfactory condition.  Pryor Curia MD

## 2020-12-06 NOTE — Progress Notes (Signed)
Patient ID: Gordon Scott, male   DOB: Nov 18, 1965, 55 y.o.   MRN: [id number]  . Post-op note  Subjective: The patient is doing well.  No complaints.  Objective: Vital signs in last 24 hours: Temp:  [96.5 F (35.8 C)-97.8 F (36.6 C)] 96.6 F (35.9 C) (05/23 1245) Pulse Rate:  [67-85] 74 (05/23 1245) Resp:  [12-23] 15 (05/23 1245) BP: (107-126)/(72-82) 114/82 (05/23 1245) SpO2:  [97 %-100 %] 100 % (05/23 1245) Weight:  [93.4 kg] 93.4 kg (05/23 0536)  Intake/Output from previous day: No intake/output data recorded. Intake/Output this shift: Total I/O In: 1920 [I.V.:1820; IV Piggyback:100] Out: 200 [Blood:200]  Physical Exam:  General: Alert and oriented. Abdomen: Soft, Nondistended. Incisions: Clean and dry. GU: Urine clearing.  Lab Results: Recent Labs    12/06/20 1148  HGB 13.8  HCT 39.9    Assessment/Plan: POD#0   1) Continue to monitor, ambulate, IS   Pryor Curia. MD   LOS: 0 days   Dutch Gray 12/06/2020, 1:59 PM

## 2020-12-06 NOTE — Anesthesia Procedure Notes (Addendum)
Procedure Name: Intubation Date/Time: 12/06/2020 7:29 AM Performed by: Eben Burow, CRNA Pre-anesthesia Checklist: Patient identified, Emergency Drugs available, Suction available, Patient being monitored and Timeout performed Patient Re-evaluated:Patient Re-evaluated prior to induction Oxygen Delivery Method: Circle system utilized Preoxygenation: Pre-oxygenation with 100% oxygen Induction Type: IV induction Ventilation: Mask ventilation without difficulty Laryngoscope Size: Mac and 4 Grade View: Grade III Tube type: Oral Number of attempts: 3 Airway Equipment and Method: Stylet Placement Confirmation: ETT inserted through vocal cords under direct vision,  positive ETCO2 and breath sounds checked- equal and bilateral Secured at: 23 cm Tube secured with: Tape Dental Injury: Teeth and Oropharynx as per pre-operative assessment  Comments: Attempted intubation by Arabella Merles paramedic student x 2, supervised by Dr Rex Kras.  DVL x 1 by Jefm Miles, CRNA.  Base of arytenoids only visualized, +/= BBS, +EtCO2.

## 2020-12-06 NOTE — Anesthesia Postprocedure Evaluation (Signed)
Anesthesia Post Note  Patient: Gordon Scott  Procedure(s) Performed: XI ROBOTIC ASSISTED LAPAROSCOPIC RADICAL PROSTATECTOMY LEVEL 2, UMBILICAL HERNIA REPAIR (N/A ) LYMPHADENECTOMY, PELVIC BILATERAL (Bilateral )     Patient location during evaluation: PACU Anesthesia Type: General Level of consciousness: awake and alert Pain management: pain level controlled Vital Signs Assessment: post-procedure vital signs reviewed and stable Respiratory status: spontaneous breathing, nonlabored ventilation, respiratory function stable and patient connected to nasal cannula oxygen Cardiovascular status: blood pressure returned to baseline and stable Postop Assessment: no apparent nausea or vomiting Anesthetic complications: no   No complications documented.  Last Vitals:  Vitals:   12/06/20 1515 12/06/20 1603  BP: (!) 154/80 117/81  Pulse: 72 75  Resp: 10 18  Temp:  36.5 C  SpO2: 100% 97%    Last Pain:  Vitals:   12/06/20 1603  TempSrc: Oral  PainSc:                  [name] [name]

## 2020-12-06 NOTE — Interval H&P Note (Signed)
History and Physical Interval Note:  12/06/2020 6:55 AM  Gordon Scott  has presented today for surgery, with the diagnosis of PROSTATE CANCER, UMBILICAL HERNIA.  The various methods of treatment have been discussed with the patient and family. After consideration of risks, benefits and other options for treatment, the patient has consented to  Procedure(s): XI ROBOTIC ASSISTED LAPAROSCOPIC RADICAL PROSTATECTOMY LEVEL 2 (N/A) LYMPHADENECTOMY, PELVIC BILATERAL (Bilateral) as a surgical intervention.  The patient's history has been reviewed, patient examined, no change in status, stable for surgery.  I have reviewed the patient's chart and labs.  Questions were answered to the patient's satisfaction.     [employer]

## 2020-12-06 NOTE — Transfer of Care (Signed)
Immediate Anesthesia Transfer of Care Note  Patient: Gordon Scott  Procedure(s) Performed: XI ROBOTIC ASSISTED LAPAROSCOPIC RADICAL PROSTATECTOMY LEVEL 2, UMBILICAL HERNIA REPAIR (N/A ) LYMPHADENECTOMY, PELVIC BILATERAL (Bilateral )  Patient Location: PACU  Anesthesia Type:General  Level of Consciousness: awake, alert  and patient cooperative  Airway & Oxygen Therapy: Patient Spontanous Breathing and Patient connected to face mask oxygen  Post-op Assessment: Report given to RN and Post -op Vital signs reviewed and stable  Post vital signs: Reviewed and stable  Last Vitals:  Vitals Value Taken Time  BP 113/72 12/06/20 1118  Temp    Pulse 72 12/06/20 1120  Resp    SpO2 100 % 12/06/20 1120  Vitals shown include unvalidated device data.  Last Pain:  Vitals:   12/06/20 0536  TempSrc:   PainSc: 0-No pain         Complications: No complications documented.

## 2020-12-06 NOTE — Anesthesia Preprocedure Evaluation (Signed)
Anesthesia Evaluation  Patient identified by MRN, date of birth, ID band Patient awake    Reviewed: Allergy & Precautions, NPO status , Patient's Chart, lab work & pertinent test results  Airway Mallampati: II  TM Distance: >3 FB Neck ROM: Full    Dental  (+) Teeth Intact   Pulmonary sleep apnea and Continuous Positive Airway Pressure Ventilation ,    Pulmonary exam normal        Cardiovascular + dysrhythmias (s/p ablation ) Atrial Fibrillation  Rhythm:Regular Rate:Normal     Neuro/Psych negative neurological ROS  negative psych ROS   GI/Hepatic negative GI ROS, Neg liver ROS,   Endo/Other  negative endocrine ROS  Renal/GU negative Renal ROS   Prostate Ca    Musculoskeletal negative musculoskeletal ROS (+)   Abdominal (+)  Abdomen: soft. Bowel sounds: normal.  Peds  Hematology negative hematology ROS (+)   Anesthesia Other Findings   Reproductive/Obstetrics                             Anesthesia Physical Anesthesia Plan  ASA: III  Anesthesia Plan: General   Post-op Pain Management:    Induction: Intravenous  PONV Risk Score and Plan: 3 and Ondansetron, Dexamethasone, Midazolam and Treatment may vary due to age or medical condition  Airway Management Planned: Mask and Oral ETT  Additional Equipment: None  Intra-op Plan:   Post-operative Plan: Extubation in OR  Informed Consent: I have reviewed the patients History and Physical, chart, labs and discussed the procedure including the risks, benefits and alternatives for the proposed anesthesia with the patient or authorized representative who has indicated his/her understanding and acceptance.     Dental advisory given  Plan Discussed with: CRNA  Anesthesia Plan Comments: (Lab Results      Component                Value               Date                      WBC                      5.5                 12/02/2020                 HGB                      14.8                12/02/2020                HCT                      42.7                12/02/2020                MCV                      87.7                12/02/2020                PLT  213                 12/02/2020           Lab Results      Component                Value               Date                      NA                       139                 12/02/2020                K                        4.1                 12/02/2020                CO2                      28                  12/02/2020                GLUCOSE                  108 (H)             12/02/2020                BUN                      15                  12/02/2020                CREATININE               1.01                12/02/2020                CALCIUM                  9.0                 12/02/2020                GFRNONAA                 >60                 12/02/2020                GFRAA                    >60                 03/26/2020          )        Anesthesia Quick Evaluation

## 2020-12-06 NOTE — Discharge Instructions (Signed)
1.  Activity:  You are encouraged to ambulate frequently (about every hour during waking hours) to help prevent blood clots from forming in your legs or lungs.  However, you should not engage in any heavy lifting (> 10-15 lbs), strenuous activity, or straining. 2. Diet: You should advance your diet as instructed by your physician.  It will be normal to have some bloating, nausea, and abdominal discomfort intermittently. 3. Prescriptions:  You will be provided a prescription for pain medication to take as needed.  If your pain is not severe enough to require the prescription pain medication, you may take extra strength Tylenol instead which will have less side effects.  You should also take a prescribed stool softener to avoid straining with bowel movements as the prescription pain medication may constipate you. 4. Incisions:  You will either have some small staples or special tissue glue at each of the incision sites. Once the bandages are removed (if present), the incisions may stay open to air.  You may start showering (but not soaking or bathing in water) the 2nd day after surgery and the incisions simply need to be patted dry after the shower.  No additional care is needed. 5. What to call us about: You should call the office [phone]) if you develop fever > 101 or develop persistent vomiting.

## 2020-12-07 ENCOUNTER — Encounter (HOSPITAL_COMMUNITY): Payer: Self-pay | Admitting: Urology

## 2020-12-07 ENCOUNTER — Other Ambulatory Visit: Payer: Self-pay

## 2020-12-07 DIAGNOSIS — K429 Umbilical hernia without obstruction or gangrene: Secondary | ICD-10-CM | POA: Diagnosis not present

## 2020-12-07 DIAGNOSIS — C61 Malignant neoplasm of prostate: Secondary | ICD-10-CM | POA: Diagnosis not present

## 2020-12-07 LAB — HEMOGLOBIN AND HEMATOCRIT, BLOOD
HCT: 36 % — ABNORMAL LOW (ref 39.0–52.0)
Hemoglobin: 12.3 g/dL — ABNORMAL LOW (ref 13.0–17.0)

## 2020-12-07 MED ORDER — CHLORHEXIDINE GLUCONATE CLOTH 2 % EX PADS
6.0000 | MEDICATED_PAD | Freq: Every day | CUTANEOUS | Status: DC
  Administered 2020-12-07: 6 via TOPICAL

## 2020-12-07 MED ORDER — TRAMADOL HCL 50 MG PO TABS
50.0000 mg | ORAL_TABLET | Freq: Four times a day (QID) | ORAL | Status: DC | PRN
  Administered 2020-12-07: 100 mg via ORAL
  Filled 2020-12-07: qty 2

## 2020-12-07 MED ORDER — BISACODYL 10 MG RE SUPP
10.0000 mg | Freq: Once | RECTAL | Status: AC
  Administered 2020-12-07: 10 mg via RECTAL
  Filled 2020-12-07: qty 1

## 2020-12-07 NOTE — Discharge Summary (Signed)
  Date of admission: 12/06/2020  Date of discharge: 12/07/2020  Admission diagnosis: Prostate Cancer  Discharge diagnosis: Prostate Cancer  History and Physical: For full details, please see admission history and physical. Briefly, Gordon Scott is a 55 y.o. gentleman with localized prostate cancer.  After discussing management/treatment options, he elected to proceed with surgical treatment.  Hospital Course: Gordon Scott was taken to the operating room on 12/06/2020 and underwent a robotic assisted laparoscopic radical prostatectomy. He tolerated this procedure well and without complications. Postoperatively, he was able to be transferred to a regular hospital room following recovery from anesthesia.  He was able to begin ambulating the night of surgery. He remained hemodynamically stable overnight.  He had excellent urine output with appropriately minimal output from his pelvic drain and his pelvic drain was removed on POD #1.  He was transitioned to oral pain medication, tolerated a clear liquid diet, and had met all discharge criteria and was able to be discharged home later on POD#1.  Laboratory values: Recent Labs    12/06/20 1148 12/07/20 0438  HGB 13.8 12.3*  HCT 39.9 36.0*    Disposition: Home  Discharge instruction: He was instructed to be ambulatory but to refrain from heavy lifting, strenuous activity, or driving. He was instructed on urethral catheter care.  Discharge medications:   Allergies as of 12/07/2020   No Known Allergies     Medication List    TAKE these medications   CALCIUM CITRATE PO Take 1 tablet by mouth daily.   JUICE PLUS FIBRE PO Take 4 tablets by mouth daily.   sulfamethoxazole-trimethoprim 800-160 MG tablet Commonly known as: BACTRIM DS Take 1 tablet by mouth 2 (two) times daily. Begin the day before your postoperative appointment for catheter removal   traMADol 50 MG tablet Commonly known as: ULTRAM Take 1-2 tablets (50-100 mg total) by  mouth every 6 (six) hours as needed (pain).       Followup: He will followup in 1 week for catheter removal and to discuss his surgical pathology results.

## 2020-12-07 NOTE — Progress Notes (Signed)
Patient ambulating in hallway and in room. Patient sitting up in chair at this time and requested his wife to be present when reviewing leg bag/foley teaching.

## 2020-12-07 NOTE — Progress Notes (Signed)
Foley/leg bag teaching provided to patient and spouse. Patient and spouse able to demonstrate care back to Probation officer. Discharge instructions reviewed. Patient discharged.

## 2020-12-07 NOTE — Progress Notes (Signed)
Pt ambulated on hall way this morning,tolerated well. 

## 2020-12-07 NOTE — Progress Notes (Signed)
Patient ID: Gordon Scott, male   DOB: 31-Oct-1965, 55 y.o.   MRN: [id number]  1 Day Post-Op Subjective: The patient is doing well.  No nausea or vomiting. Pain is adequately controlled.  Objective: Vital signs in last 24 hours: Temp:  [96.5 F (35.8 C)-99.3 F (37.4 C)] 99 F (37.2 C) (05/24 0416) Pulse Rate:  [64-78] 66 (05/24 0416) Resp:  [10-23] 18 (05/24 0416) BP: (106-154)/(59-82) 107/70 (05/24 0416) SpO2:  [88 %-100 %] 88 % (05/24 0416) Weight:  [96.2 kg] 96.2 kg (05/23 1603)  Intake/Output from previous day: 05/23 0701 - 05/24 0700 In: 3789.4 [I.V.:3689.4; IV Piggyback:100] Out: 2845 [Urine:2375; Drains:270; Blood:200] Intake/Output this shift: No intake/output data recorded.  Physical Exam:  General: Alert and oriented. CV: RRR Lungs: Clear bilaterally. GI: Soft, Nondistended. Incisions: Clean, dry, and intact Urine: Clear Extremities: Nontender, no erythema, no edema.  Lab Results: Recent Labs    12/06/20 1148 12/07/20 0438  HGB 13.8 12.3*  HCT 39.9 36.0*      Assessment/Plan: POD# 1 s/p robotic prostatectomy.  1) SL IVF 2) Ambulate, Incentive spirometry 3) Transition to oral pain medication 4) Dulcolax suppository 5) D/C pelvic drain 6) Plan for likely discharge later today   Gordon Scott. MD   LOS: 0 days   Gordon Scott 12/07/2020, 7:32 AM

## 2020-12-14 DIAGNOSIS — C61 Malignant neoplasm of prostate: Secondary | ICD-10-CM | POA: Diagnosis not present

## 2020-12-17 LAB — SURGICAL PATHOLOGY

## 2020-12-21 ENCOUNTER — Other Ambulatory Visit: Payer: Self-pay

## 2020-12-21 ENCOUNTER — Encounter (HOSPITAL_COMMUNITY)
Admission: RE | Admit: 2020-12-21 | Discharge: 2020-12-21 | Disposition: A | Discharge status: Home / Self Care | Payer: BC Managed Care – PPO | Source: Ambulatory Visit | Attending: Urology | Admitting: Urology

## 2020-12-21 DIAGNOSIS — Z9079 Acquired absence of other genital organ(s): Secondary | ICD-10-CM | POA: Diagnosis not present

## 2020-12-21 DIAGNOSIS — N62 Hypertrophy of breast: Secondary | ICD-10-CM | POA: Diagnosis not present

## 2020-12-21 DIAGNOSIS — C61 Malignant neoplasm of prostate: Secondary | ICD-10-CM | POA: Diagnosis not present

## 2020-12-21 DIAGNOSIS — K449 Diaphragmatic hernia without obstruction or gangrene: Secondary | ICD-10-CM | POA: Diagnosis not present

## 2020-12-21 DIAGNOSIS — K7689 Other specified diseases of liver: Secondary | ICD-10-CM | POA: Diagnosis not present

## 2020-12-21 MED ORDER — TECHNETIUM TC 99M MEDRONATE IV KIT
20.0000 | PACK | Freq: Once | INTRAVENOUS | Status: AC | PRN
  Administered 2020-12-21: 20.6 via INTRAVENOUS

## 2020-12-31 DIAGNOSIS — M62838 Other muscle spasm: Secondary | ICD-10-CM | POA: Diagnosis not present

## 2020-12-31 DIAGNOSIS — M6281 Muscle weakness (generalized): Secondary | ICD-10-CM | POA: Diagnosis not present

## 2020-12-31 DIAGNOSIS — N393 Stress incontinence (female) (male): Secondary | ICD-10-CM | POA: Diagnosis not present

## 2021-01-06 ENCOUNTER — Inpatient Hospital Stay: Payer: BC Managed Care – PPO | Attending: Hematology & Oncology

## 2021-01-06 ENCOUNTER — Encounter: Payer: Self-pay | Admitting: Hematology & Oncology

## 2021-01-06 ENCOUNTER — Inpatient Hospital Stay (HOSPITAL_BASED_OUTPATIENT_CLINIC_OR_DEPARTMENT_OTHER): Payer: BC Managed Care – PPO | Admitting: Hematology & Oncology

## 2021-01-06 ENCOUNTER — Telehealth: Payer: Self-pay

## 2021-01-06 ENCOUNTER — Other Ambulatory Visit: Payer: Self-pay

## 2021-01-06 VITALS — BP 110/74 | HR 66 | Temp 98.7°F | Resp 16 | Wt 204.0 lb

## 2021-01-06 DIAGNOSIS — Z9079 Acquired absence of other genital organ(s): Secondary | ICD-10-CM | POA: Insufficient documentation

## 2021-01-06 DIAGNOSIS — Z79899 Other long term (current) drug therapy: Secondary | ICD-10-CM | POA: Diagnosis not present

## 2021-01-06 DIAGNOSIS — C61 Malignant neoplasm of prostate: Secondary | ICD-10-CM | POA: Insufficient documentation

## 2021-01-06 DIAGNOSIS — R232 Flushing: Secondary | ICD-10-CM | POA: Insufficient documentation

## 2021-01-06 LAB — CBC WITH DIFFERENTIAL (CANCER CENTER ONLY)
Abs Immature Granulocytes: 0.02 10*3/uL (ref 0.00–0.07)
Basophils Absolute: 0 10*3/uL (ref 0.0–0.1)
Basophils Relative: 1 %
Eosinophils Absolute: 0.1 10*3/uL (ref 0.0–0.5)
Eosinophils Relative: 2 %
HCT: 40.3 % (ref 39.0–52.0)
Hemoglobin: 13.7 g/dL (ref 13.0–17.0)
Immature Granulocytes: 0 %
Lymphocytes Relative: 28 %
Lymphs Abs: 1.6 10*3/uL (ref 0.7–4.0)
MCH: 29.7 pg (ref 26.0–34.0)
MCHC: 34 g/dL (ref 30.0–36.0)
MCV: 87.4 fL (ref 80.0–100.0)
Monocytes Absolute: 0.5 10*3/uL (ref 0.1–1.0)
Monocytes Relative: 8 %
Neutro Abs: 3.4 10*3/uL (ref 1.7–7.7)
Neutrophils Relative %: 61 %
Platelet Count: 187 10*3/uL (ref 150–400)
RBC: 4.61 MIL/uL (ref 4.22–5.81)
RDW: 12.7 % (ref 11.5–15.5)
WBC Count: 5.6 10*3/uL (ref 4.0–10.5)
nRBC: 0 % (ref 0.0–0.2)

## 2021-01-06 LAB — CMP (CANCER CENTER ONLY)
ALT: 19 U/L (ref 0–44)
AST: 13 U/L — ABNORMAL LOW (ref 15–41)
Albumin: 4.1 g/dL (ref 3.5–5.0)
Alkaline Phosphatase: 111 U/L (ref 38–126)
Anion gap: 8 (ref 5–15)
BUN: 19 mg/dL (ref 6–20)
CO2: 28 mmol/L (ref 22–32)
Calcium: 9.7 mg/dL (ref 8.9–10.3)
Chloride: 107 mmol/L (ref 98–111)
Creatinine: 1.01 mg/dL (ref 0.61–1.24)
GFR, Estimated: >60 mL/min (ref >60)
Glucose, Bld: 127 mg/dL — ABNORMAL HIGH (ref 70–99)
Potassium: 4.2 mmol/L (ref 3.5–5.1)
Sodium: 143 mmol/L (ref 135–145)
Total Bilirubin: 0.3 mg/dL (ref 0.3–1.2)
Total Protein: 6.7 g/dL (ref 6.5–8.1)

## 2021-01-06 NOTE — Progress Notes (Signed)
Hematology and Oncology Follow Up Visit  Gordon Scott [id number] [birth year] 55 y.o. 01/06/2021   Principle Diagnosis:  Prostate cancer -- High risk -- localized  Current Therapy:   Neoadjuvant therapy -- Clinical Trial out of [facility] S/p radical prostatectomy Adjuvant anti-androgen therapy -- clinical trial     Interim History:  Gordon Scott is back for follow-up.  He finally had the radical prostatectomy.  This is after neoadjuvant antiandrogen therapy.  He had an incredible response antiandrogen therapy.  His PSA went from 127 down to 0.1.  He had the radical prostatectomy on 12/06/2020.  The pathology report ([id number]) showed dramatic response to treatment.  He had small focus of prostatic adenocarcinoma.  All 14 lymph nodes were negative.  There was no involvement of seminal vesicles, Baz difference.  His Gleason grade was 7.  Again, this was a dramatic response.  I think Dr. Alinda Money was very pleased with how well he did.  He really has very little way of urinary incontinence now.  He has a little bit of leaking but again not all that bad.  There has been some hot flashes and sweats.  I think this is all relative to his testosterone deprivation.  He has had no problems with cough or shortness of breath.  He has had no issues with leg swelling.  He has had no rashes.  He has had no fever.  Overall, his performance status is ECOG 0.    Medications:  Current Outpatient Medications:    CALCIUM CITRATE PO, Take 1 tablet by mouth daily., Disp: , Rfl:    Nutritional Supplements (JUICE PLUS FIBRE PO), Take 4 tablets by mouth daily., Disp: , Rfl:    sulfamethoxazole-trimethoprim (BACTRIM DS) 800-160 MG tablet, Take 1 tablet by mouth 2 (two) times daily. Begin the day before your postoperative appointment for catheter removal, Disp: 6 tablet, Rfl: 0   traMADol (ULTRAM) 50 MG tablet, Take 1-2 tablets (50-100 mg total) by mouth every 6 (six) hours as needed (pain)., Disp: 15  tablet, Rfl: 0  Allergies: No Known Allergies  Past Medical History, Surgical history, Social history, and Family History were reviewed and updated.  Review of Systems: Review of Systems  Constitutional: Negative.   HENT:  Negative.    Eyes: Negative.   Respiratory: Negative.    Gastrointestinal: Negative.   Endocrine: Positive for hot flashes.  Genitourinary: Negative.    Musculoskeletal:  Positive for arthralgias.  Neurological: Negative.   Hematological: Negative.   Psychiatric/Behavioral: Negative.     Physical Exam:  weight is 204 lb (92.5 kg). His oral temperature is 98.7 F (37.1 C). His blood pressure is 110/74 and his pulse is 66. His respiration is 16 and oxygen saturation is 100%.   Wt Readings from Last 3 Encounters:  01/06/21 204 lb (92.5 kg)  12/06/20 212 lb (96.2 kg)  12/02/20 206 lb (93.4 kg)    Physical Exam Vitals reviewed.  HENT:     Head: Normocephalic and atraumatic.  Eyes:     Pupils: Pupils are equal, round, and reactive to light.  Cardiovascular:     Rate and Rhythm: Normal rate and regular rhythm.     Heart sounds: Normal heart sounds.  Pulmonary:     Effort: Pulmonary effort is normal.     Breath sounds: Normal breath sounds.  Abdominal:     General: Bowel sounds are normal.     Palpations: Abdomen is soft.  Musculoskeletal:        General: No  tenderness or deformity. Normal range of motion.     Cervical back: Normal range of motion.  Lymphadenopathy:     Cervical: No cervical adenopathy.  Skin:    General: Skin is warm and dry.     Findings: No erythema or rash.  Neurological:     Mental Status: He is alert and oriented to person, place, and time.  Psychiatric:        Behavior: Behavior normal.        Thought Content: Thought content normal.        Judgment: Judgment normal.   Lab Results  Component Value Date   WBC 5.6 01/06/2021   HGB 13.7 01/06/2021   HCT 40.3 01/06/2021   MCV 87.4 01/06/2021   PLT 187 01/06/2021      Chemistry      Component Value Date/Time   NA 143 01/06/2021 1455   K 4.2 01/06/2021 1455   CL 107 01/06/2021 1455   CO2 28 01/06/2021 1455   BUN 19 01/06/2021 1455   CREATININE 1.01 01/06/2021 1455      Component Value Date/Time   CALCIUM 9.7 01/06/2021 1455   ALKPHOS 111 01/06/2021 1455   AST 13 (L) 01/06/2021 1455   ALT 19 01/06/2021 1455   BILITOT 0.3 01/06/2021 1455      Impression and Plan: Gordon Scott is a very nice 55 year old white male.  He has a very high risk localized prostate cancer.  He is on neoadjuvant therapy right now.  He is on a clinical trial.  He ultimately underwent radical prostatectomy.  He had a fantastic response to treatment.  Very little active prostate cancer left.  I think the fact that his PSA was down to 0.1 at the time of his surgery indicated how well he was doing.  He is on antiandrogen therapy right now.  Again this part of the clinical trial.  I will plan to get him back in 3 months.  I think he sees Dr. Alinda Money monthly.  I am sure that his PSA will dictate whether or not he has recurrence.  Provider have to think that his risk of recurrence is going to be quite low.    Volanda Napoleon, MD 6/23/20225:19 PM

## 2021-01-06 NOTE — Telephone Encounter (Signed)
Appts made and printed per verbal los/pt 01/06/21   Gordon Scott

## 2021-01-07 LAB — PSA, TOTAL AND FREE
PSA, Free Pct: UNDETERMINED %
PSA, Free: <0.01 ng/mL
Prostate Specific Ag, Serum: <0.1 ng/mL (ref 0.0–4.0)

## 2021-01-07 LAB — TESTOSTERONE: Testosterone: 9 ng/dL — ABNORMAL LOW (ref 264–916)

## 2021-01-13 DIAGNOSIS — M62838 Other muscle spasm: Secondary | ICD-10-CM | POA: Diagnosis not present

## 2021-01-13 DIAGNOSIS — M6281 Muscle weakness (generalized): Secondary | ICD-10-CM | POA: Diagnosis not present

## 2021-01-13 DIAGNOSIS — N393 Stress incontinence (female) (male): Secondary | ICD-10-CM | POA: Diagnosis not present

## 2021-02-02 DIAGNOSIS — M62838 Other muscle spasm: Secondary | ICD-10-CM | POA: Diagnosis not present

## 2021-02-02 DIAGNOSIS — N393 Stress incontinence (female) (male): Secondary | ICD-10-CM | POA: Diagnosis not present

## 2021-02-02 DIAGNOSIS — M6281 Muscle weakness (generalized): Secondary | ICD-10-CM | POA: Diagnosis not present

## 2021-03-12 DIAGNOSIS — L2089 Other atopic dermatitis: Secondary | ICD-10-CM | POA: Diagnosis not present

## 2021-03-14 DIAGNOSIS — L298 Other pruritus: Secondary | ICD-10-CM | POA: Diagnosis not present

## 2021-03-14 DIAGNOSIS — T63791A Toxic effect of contact with other venomous plant, accidental (unintentional), initial encounter: Secondary | ICD-10-CM | POA: Diagnosis not present

## 2021-03-14 DIAGNOSIS — R238 Other skin changes: Secondary | ICD-10-CM | POA: Diagnosis not present

## 2021-03-14 DIAGNOSIS — Z6833 Body mass index (BMI) 33.0-33.9, adult: Secondary | ICD-10-CM | POA: Diagnosis not present

## 2021-04-14 ENCOUNTER — Encounter: Payer: Self-pay | Admitting: Hematology & Oncology

## 2021-04-14 ENCOUNTER — Inpatient Hospital Stay (HOSPITAL_BASED_OUTPATIENT_CLINIC_OR_DEPARTMENT_OTHER): Payer: BC Managed Care – PPO | Admitting: Hematology & Oncology

## 2021-04-14 ENCOUNTER — Other Ambulatory Visit: Payer: Self-pay

## 2021-04-14 ENCOUNTER — Inpatient Hospital Stay: Payer: BC Managed Care – PPO | Attending: Hematology & Oncology

## 2021-04-14 VITALS — BP 134/87 | HR 57 | Temp 98.6°F | Resp 16 | Wt 175.0 lb

## 2021-04-14 DIAGNOSIS — Z79890 Hormone replacement therapy (postmenopausal): Secondary | ICD-10-CM | POA: Insufficient documentation

## 2021-04-14 DIAGNOSIS — Z79899 Other long term (current) drug therapy: Secondary | ICD-10-CM | POA: Diagnosis not present

## 2021-04-14 DIAGNOSIS — R232 Flushing: Secondary | ICD-10-CM | POA: Diagnosis not present

## 2021-04-14 DIAGNOSIS — M2550 Pain in unspecified joint: Secondary | ICD-10-CM | POA: Insufficient documentation

## 2021-04-14 DIAGNOSIS — Z9221 Personal history of antineoplastic chemotherapy: Secondary | ICD-10-CM | POA: Insufficient documentation

## 2021-04-14 DIAGNOSIS — Z9079 Acquired absence of other genital organ(s): Secondary | ICD-10-CM | POA: Insufficient documentation

## 2021-04-14 DIAGNOSIS — C61 Malignant neoplasm of prostate: Secondary | ICD-10-CM | POA: Diagnosis present

## 2021-04-14 DIAGNOSIS — E291 Testicular hypofunction: Secondary | ICD-10-CM | POA: Diagnosis not present

## 2021-04-14 LAB — CBC WITH DIFFERENTIAL (CANCER CENTER ONLY)
Abs Immature Granulocytes: 0.04 10*3/uL (ref 0.00–0.07)
Basophils Absolute: 0.1 10*3/uL (ref 0.0–0.1)
Basophils Relative: 1 %
Eosinophils Absolute: 0.1 10*3/uL (ref 0.0–0.5)
Eosinophils Relative: 1 %
HCT: 43.1 % (ref 39.0–52.0)
Hemoglobin: 14.9 g/dL (ref 13.0–17.0)
Immature Granulocytes: 1 %
Lymphocytes Relative: 19 %
Lymphs Abs: 1.4 10*3/uL (ref 0.7–4.0)
MCH: 30 pg (ref 26.0–34.0)
MCHC: 34.6 g/dL (ref 30.0–36.0)
MCV: 86.9 fL (ref 80.0–100.0)
Monocytes Absolute: 0.5 10*3/uL (ref 0.1–1.0)
Monocytes Relative: 6 %
Neutro Abs: 5.5 10*3/uL (ref 1.7–7.7)
Neutrophils Relative %: 72 %
Platelet Count: 262 10*3/uL (ref 150–400)
RBC: 4.96 MIL/uL (ref 4.22–5.81)
RDW: 12.7 % (ref 11.5–15.5)
WBC Count: 7.6 10*3/uL (ref 4.0–10.5)
nRBC: 0 % (ref 0.0–0.2)

## 2021-04-14 LAB — CMP (CANCER CENTER ONLY)
ALT: 17 U/L (ref 0–44)
AST: 19 U/L (ref 15–41)
Albumin: 4 g/dL (ref 3.5–5.0)
Alkaline Phosphatase: 127 U/L — ABNORMAL HIGH (ref 38–126)
Anion gap: 9 (ref 5–15)
BUN: 14 mg/dL (ref 6–20)
CO2: 28 mmol/L (ref 22–32)
Calcium: 9.4 mg/dL (ref 8.9–10.3)
Chloride: 102 mmol/L (ref 98–111)
Creatinine: 1.14 mg/dL (ref 0.61–1.24)
GFR, Estimated: >60 mL/min (ref >60)
Glucose, Bld: 114 mg/dL — ABNORMAL HIGH (ref 70–99)
Potassium: 3.9 mmol/L (ref 3.5–5.1)
Sodium: 139 mmol/L (ref 135–145)
Total Bilirubin: 0.2 mg/dL — ABNORMAL LOW (ref 0.3–1.2)
Total Protein: 7.4 g/dL (ref 6.5–8.1)

## 2021-04-14 NOTE — Progress Notes (Signed)
Hematology and Oncology Follow Up Visit  Gordon Scott [id number] 1965/10/24 55 y.o. 04/14/2021   Principle Diagnosis:  Prostate cancer -- High risk -- localized  Current Therapy:   Neoadjuvant therapy -- Clinical Trial out of [facility] S/p radical prostatectomy Adjuvant anti-androgen therapy -- clinical trial     Interim History:  Gordon Scott is back for follow-up.  He is doing incredibly well with the androgen deprivation.  He is on a clinical trial.  Over last saw him in June, there is no PSA that could be detected.  His testosterone level was 9.  Unfortunately, he is having the effects of low testosterone.  He is having hot flashes.  He has had a weight gain.  He does not have a lot of energy.  I feel bad for him.  However, he had a very aggressive prostate cancer that really needed aggressive antiandrogen therapy.  He says that the Lupron is Scott to stop in December per the clinical trial.  Otherwise, he is doing okay.  He has had no fever.  He has had no cough or shortness of breath.  He has had no change in bowel or bladder habits.  He really does not have much in the way of incontinence of urine.  He has had no leg swelling.  There is been no rashes.  Overall, I would say his performance status is probably ECOG 1.    Medications:  Current Outpatient Medications:    CALCIUM CITRATE PO, Take 1 tablet by mouth daily., Disp: , Rfl:    Nutritional Supplements (JUICE PLUS FIBRE PO), Take 4 tablets by mouth daily., Disp: , Rfl:   Allergies: No Known Allergies  Past Medical History, Surgical history, Social history, and Family History were reviewed and updated.  Review of Systems: Review of Systems  Constitutional: Negative.   HENT:  Negative.    Eyes: Negative.   Respiratory: Negative.    Gastrointestinal: Negative.   Endocrine: Positive for hot flashes.  Genitourinary: Negative.    Musculoskeletal:  Positive for arthralgias.  Neurological: Negative.    Hematological: Negative.   Psychiatric/Behavioral: Negative.     Physical Exam:  weight is 217 lb (98.4 kg). His oral temperature is 98.3 F (36.8 C). His blood pressure is 115/77 and his pulse is 76. His respiration is 18 and oxygen saturation is 99%.   Wt Readings from Last 3 Encounters:  04/14/21 217 lb (98.4 kg)  01/06/21 204 lb (92.5 kg)  12/06/20 212 lb (96.2 kg)    Physical Exam Vitals reviewed.  HENT:     Head: Normocephalic and atraumatic.  Eyes:     Pupils: Pupils are equal, round, and reactive to light.  Cardiovascular:     Rate and Rhythm: Normal rate and regular rhythm.     Heart sounds: Normal heart sounds.  Pulmonary:     Effort: Pulmonary effort is normal.     Breath sounds: Normal breath sounds.  Abdominal:     General: Bowel sounds are normal.     Palpations: Abdomen is soft.  Musculoskeletal:        General: No tenderness or deformity. Normal range of motion.     Cervical back: Normal range of motion.  Lymphadenopathy:     Cervical: No cervical adenopathy.  Skin:    General: Skin is warm and dry.     Findings: No erythema or rash.  Neurological:     Mental Status: He is alert and oriented to person, place, and time.  Psychiatric:        Behavior: Behavior normal.        Thought Content: Thought content normal.        Judgment: Judgment normal.   Lab Results  Component Value Date   WBC 7.6 04/14/2021   HGB 14.9 04/14/2021   HCT 43.1 04/14/2021   MCV 86.9 04/14/2021   PLT 262 04/14/2021     Chemistry      Component Value Date/Time   NA 139 04/14/2021 1440   K 3.9 04/14/2021 1440   CL 102 04/14/2021 1440   CO2 28 04/14/2021 1440   BUN 14 04/14/2021 1440   CREATININE 1.14 04/14/2021 1440      Component Value Date/Time   CALCIUM 9.4 04/14/2021 1440   ALKPHOS 127 (H) 04/14/2021 1440   AST 19 04/14/2021 1440   ALT 17 04/14/2021 1440   BILITOT 0.2 (L) 04/14/2021 1440      Impression and Plan: Gordon Scott is a very nice 55 year old  white male.  He has a very high risk localized prostate cancer.  He completed neoadjuvant therapy.  He subsequently underwent a prostatectomy.  He had an incredible response per their pathology report.  He is on adjuvant antiandrogen therapy now.  Again he will stop his clinical trial in December.  He sees Dr. Alinda Money of [facility] monthly.  As such, I probably do not need to see him back probably until next year.  I am just so happy that he is done well that hopefully he will be cured.     Volanda Napoleon, MD 9/29/20223:18 PM

## 2021-04-15 ENCOUNTER — Encounter: Payer: Self-pay | Admitting: *Deleted

## 2021-04-15 LAB — PSA, TOTAL AND FREE
PSA, Free Pct: UNDETERMINED %
PSA, Free: <0.01 ng/mL
Prostate Specific Ag, Serum: <0.1 ng/mL (ref 0.0–4.0)

## 2021-04-15 LAB — TESTOSTERONE: Testosterone: <3 ng/dL — ABNORMAL LOW (ref 264–916)

## 2021-07-25 ENCOUNTER — Telehealth: Payer: Self-pay | Admitting: Hematology & Oncology

## 2021-08-11 ENCOUNTER — Other Ambulatory Visit: Payer: BC Managed Care – PPO

## 2021-08-11 ENCOUNTER — Ambulatory Visit: Payer: BC Managed Care – PPO | Admitting: Hematology & Oncology

## 2021-08-29 ENCOUNTER — Inpatient Hospital Stay: Payer: BC Managed Care – PPO | Attending: Nurse Practitioner

## 2021-08-29 ENCOUNTER — Inpatient Hospital Stay: Payer: BC Managed Care – PPO | Admitting: Hematology & Oncology

## 2021-10-24 ENCOUNTER — Other Ambulatory Visit (HOSPITAL_COMMUNITY): Payer: Self-pay | Admitting: Urology

## 2021-10-24 DIAGNOSIS — C61 Malignant neoplasm of prostate: Secondary | ICD-10-CM

## 2021-11-09 ENCOUNTER — Ambulatory Visit (HOSPITAL_COMMUNITY)
Admission: RE | Admit: 2021-11-09 | Discharge: 2021-11-09 | Disposition: A | Discharge status: Home / Self Care | Payer: BC Managed Care – PPO | Source: Ambulatory Visit | Attending: Urology | Admitting: Urology

## 2021-11-09 DIAGNOSIS — C61 Malignant neoplasm of prostate: Secondary | ICD-10-CM | POA: Insufficient documentation

## 2021-11-09 MED ORDER — PIFLIFOLASTAT F 18 (PYLARIFY) INJECTION
9.0000 | Freq: Once | INTRAVENOUS | Status: AC
  Administered 2021-11-09: 9 via INTRAVENOUS

## 2022-06-22 ENCOUNTER — Other Ambulatory Visit (HOSPITAL_COMMUNITY): Payer: Self-pay | Admitting: Urology

## 2022-06-22 DIAGNOSIS — C61 Malignant neoplasm of prostate: Secondary | ICD-10-CM

## 2022-06-28 ENCOUNTER — Other Ambulatory Visit (HOSPITAL_COMMUNITY): Payer: Self-pay | Admitting: Urology

## 2022-06-28 DIAGNOSIS — C61 Malignant neoplasm of prostate: Secondary | ICD-10-CM

## 2022-07-12 ENCOUNTER — Encounter (HOSPITAL_COMMUNITY)
Admission: RE | Admit: 2022-07-12 | Discharge: 2022-07-12 | Disposition: A | Discharge status: Home / Self Care | Payer: BC Managed Care – PPO | Source: Ambulatory Visit | Attending: Urology | Admitting: Urology

## 2022-07-12 DIAGNOSIS — C61 Malignant neoplasm of prostate: Secondary | ICD-10-CM

## 2022-07-12 MED ORDER — PIFLIFOLASTAT F 18 (PYLARIFY) INJECTION
9.0000 | Freq: Once | INTRAVENOUS | Status: AC
  Administered 2022-07-12: 9.1 via INTRAVENOUS

## 2022-07-13 ENCOUNTER — Ambulatory Visit (HOSPITAL_COMMUNITY)
Admission: RE | Admit: 2022-07-13 | Discharge: 2022-07-13 | Disposition: A | Discharge status: Home / Self Care | Payer: BC Managed Care – PPO | Source: Ambulatory Visit | Attending: Urology | Admitting: Urology

## 2022-07-13 ENCOUNTER — Encounter (HOSPITAL_COMMUNITY)
Admission: RE | Admit: 2022-07-13 | Discharge: 2022-07-13 | Disposition: A | Discharge status: Home / Self Care | Payer: BC Managed Care – PPO | Source: Ambulatory Visit | Attending: Urology | Admitting: Urology

## 2022-07-13 DIAGNOSIS — C61 Malignant neoplasm of prostate: Secondary | ICD-10-CM

## 2022-07-13 MED ORDER — TECHNETIUM TC 99M MEDRONATE IV KIT
21.3000 | PACK | Freq: Once | INTRAVENOUS | Status: AC
  Administered 2022-07-13: 21.3 via INTRAVENOUS

## 2022-07-20 ENCOUNTER — Telehealth: Payer: Self-pay

## 2022-07-20 NOTE — Telephone Encounter (Signed)
    1. I confirmed with the patient he is aware of his referral to the clinic 1/26 arriving @ 12:30 pm.    2. I discussed the format of the clinic and the physicians he will be seeing that day.   3. I discussed where the clinic is located and how to contact me.   4. I confirmed his address and informed him I would be mailing a packet of information and forms to be completed. I asked him to bring them with him the day of his appointment.    He voiced understanding of the above. I asked him to call me if he has any questions or concerns regarding his appointments or the forms he needs to complete.

## 2022-08-07 ENCOUNTER — Telehealth: Payer: Self-pay

## 2022-08-07 NOTE — Progress Notes (Addendum)
Care Plan Summary  Name: Gordon Scott DOB: [birth year]   Your Medical Team:   Urologist -  Dr. Raynelle Bring, [facility]  Radiation Oncologist - Dr. Tyler Pita, [place]     Recommendations: 1) Short term hormonal therapy   2) Radiation to the prostate fossa.    * These recommendations are based on information available as of today's consult.      Recommendations may change depending on the results of further tests or exams.    Next Steps: 1) You will be contacted for an appointment with [facility] to start your hormonal therapy.   2) The radiation department will set you up for a CT Simulation. Friday 08/18/22 @ 2pm.  When appointments need to be scheduled, you will be contacted by [facility] and/or [facility].  Questions?  Please do not hesitate to call Katheren Puller, BSN, RN at [phone] with any questions or concerns.  Kathlee Nations is your Oncology Nurse Navigator and is available to assist you while you're receiving your medical care at [facility].

## 2022-08-07 NOTE — Telephone Encounter (Signed)
Spoke to patients wife to confirm packet made it and not to forget to fill it out and bring it to [place] appointment, she confirm packet did arrive and she would make sure patient will fill it out

## 2022-08-08 ENCOUNTER — Ambulatory Visit
Admission: RE | Admit: 2022-08-08 | Discharge: 2022-08-08 | Disposition: A | Discharge status: Home / Self Care | Payer: BC Managed Care – PPO | Source: Ambulatory Visit | Attending: Radiation Oncology | Admitting: Radiation Oncology

## 2022-08-08 ENCOUNTER — Inpatient Hospital Stay: Payer: BC Managed Care – PPO | Attending: Radiation Oncology | Admitting: Genetic Counselor

## 2022-08-08 VITALS — BP 109/78 | HR 50 | Temp 97.7°F | Resp 20 | Ht 67.0 in | Wt 211.0 lb

## 2022-08-08 DIAGNOSIS — C61 Malignant neoplasm of prostate: Secondary | ICD-10-CM

## 2022-08-08 NOTE — Progress Notes (Signed)
Radiation Oncology         [phone] ________________________________  Multidisciplinary Prostate Cancer Clinic  Initial Radiation Oncology Consultation  Name: Gordon Scott MRN: [id number]  Date: 08/08/2022  DOB: [birth year]  PO:[facility]  Raynelle Bring, MD   REFERRING PHYSICIAN: Raynelle Bring, MD  DIAGNOSIS: 57 y.o. gentleman with biochemically recurrent prostate cancer with a current posttreatment PSA of 0.58 s/p RALP 11/2020 for Stage yp(T2 N0), Gleason 5+4 prostate cancer(PROTEUS).    ICD-10-CM   1. Prostate cancer ([place])  C61       HISTORY OF PRESENT ILLNESS: Gordon Scott is a 57 y.o. male with a diagnosis of biochemically recurrent prostate cancer. He was initially diagnosed with very high risk, high-volume, Gleason 5+4 prostate cancer in 02/2020 with a PSA of 126.        He underwent staging CT and bone scan at that time, which were both negative for metastatic disease. He opted to enroll in the PROTEUS trial and received 6 months of neoadjuvant ADT +/- placebo beginning in October 2021 followed by a non-nerve-sparing robotic assisted laparoscopic radical prostatectomy and BPLND on Dec 06, 2020 under the care of Dr. Alinda Money.  Final surgical pathology revealed a small foci of residual prostatic adenocarcinoma, showing significant antiandrogen treatment effect. All 14 lymph nodes, seminal vesicles, and resection margins were negative. He completed an additional 6 months of adjuvant ADT +/- placebo, with his last dose in 06/2021, per the PROTEUS study.       He had a disease restaging CT C/A/P on 12/21/2020 which was without any evidence of metastatic disease.  His PSA remained undetectable and a disease restaging PSMA PET scan, as part of the Proteus trial, was performed on 10/2021 showing no evidence of local recurrence or metastatic disease.  More recently, his PSA became detectable at 0.24 on 06/08/2022. This prompted restaging  PSMA PET scan which was performed on 07/12/22 showing no evidence of recurrent or metastatic prostate carcinoma. His most recent PSA from 08/02/2022 had increased further, at 0.58.  The patient reviewed the lab and imaging results with his urologist and he has kindly been referred today to the multidisciplinary prostate cancer clinic for presentation of pathology and radiology studies in our conference for discussion of potential radiation treatment options and clinical evaluation.   PREVIOUS RADIATION THERAPY: No  PAST MEDICAL HISTORY:  Past Medical History:  Diagnosis Date   COVID-19 virus infection 04/29/2019   Last Assessment & Plan:  Asymptomatic preprocedural Covid screen positive Patient is not requiring any oxygen at present time Patient remains asymptomatic, D-dimer < 500. Advanced droplet precautions.  - Patient does not meet criteria for remdesivir or Decadron per Hospital treatment alogarithm  -- Supportive treatment  -- Will continue to Monitor.   Dysrhythmia    SVT- 3 years ago ablation done in [place]- hadfollowup then no longer sees cardiologist    Goals of care, counseling/discussion 03/26/2020   Prostate cancer ([place]) 03/26/2020   Sleep apnea    mild , cpap       PAST SURGICAL HISTORY: Past Surgical History:  Procedure Laterality Date   ABLATION     LYMPHADENECTOMY Bilateral 12/06/2020   Procedure: LYMPHADENECTOMY, PELVIC BILATERAL;  Surgeon: Raynelle Bring, MD;  Location: [facility];  Service: Urology;  Laterality: Bilateral;   ROBOT ASSISTED LAPAROSCOPIC RADICAL PROSTATECTOMY N/A 12/06/2020   Procedure: XI ROBOTIC ASSISTED LAPAROSCOPIC RADICAL PROSTATECTOMY LEVEL 2, UMBILICAL HERNIA REPAIR;  Surgeon: Raynelle Bring, MD;  Location: [facility];  Service: Urology;  Laterality: N/A;   SPINAL FUSION      FAMILY HISTORY: No family history on file.  SOCIAL HISTORY:  Social History   Socioeconomic History   Marital status: Married    Spouse name: Not on file   Number of  children: Not on file   Years of education: Not on file   Highest education level: Not on file  Occupational History   Not on file  Tobacco Use   Smoking status: Never   Smokeless tobacco: Former    Types: Snuff    Quit date: 05/12/2017  Vaping Use   Vaping Use: Never used  Substance and Sexual Activity   Alcohol use: Never   Drug use: Never   Sexual activity: Not on file  Other Topics Concern   Not on file  Social History Narrative   Not on file   Social Determinants of Health   Financial Resource Strain: Not on file  Food Insecurity: Not on file  Transportation Needs: Not on file  Physical Activity: Not on file  Stress: Not on file  Social Connections: Not on file  Intimate Partner Violence: Not on file    ALLERGIES: Patient has no known allergies.  MEDICATIONS:  Current Outpatient Medications  Medication Sig Dispense Refill   CALCIUM CITRATE PO Take 1 tablet by mouth daily.     Nutritional Supplements (JUICE PLUS FIBRE PO) Take 4 tablets by mouth daily.     No current facility-administered medications for this encounter.    REVIEW OF SYSTEMS:  On review of systems, the patient reports that he is doing well overall. He denies any chest pain, shortness of breath, cough, fevers, chills, night sweats, unintended weight changes. He denies any bowel disturbances, and denies abdominal pain, nausea or vomiting. He denies any new musculoskeletal or joint aches or pains. His IPSS was 2, indicating mild urinary symptoms. 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 A complete review of systems is obtained and is otherwise negative.    PHYSICAL EXAM:  Wt Readings from Last 3 Encounters:  07/12/22 213 lb (96.6 kg)  04/14/21 175 lb (79.4 kg)  01/06/21 204 lb (92.5 kg)   Temp Readings from Last 3 Encounters:  04/14/21 98.6 F (37 C) (Oral)  01/06/21 98.7 F (37.1 C) (Oral)  12/07/20 98 F (36.7 C) (Oral)   BP Readings from Last 3  Encounters:  04/14/21 134/87  01/06/21 110/74  12/07/20 106/68   Pulse Readings from Last 3 Encounters:  04/14/21 (!) 57  01/06/21 66  12/07/20 62    /10  In general this is a well appearing Caucasian man in no acute distress. He's alert and oriented x4 and appropriate throughout the examination. Cardiopulmonary assessment is negative for acute distress, and he exhibits normal effort.     KPS = 100  100 - Normal; no complaints; no evidence of disease. 90   - Able to carry on normal activity; minor signs or symptoms of disease. 80   - Normal activity with effort; some signs or symptoms of disease. 2   - Cares for self; unable to carry on normal activity or to do active work. 60   - Requires occasional assistance, but is able to care for most of his personal needs. 50   - Requires considerable assistance and frequent medical care. 86   - Disabled; requires special care and assistance. 68   - Severely disabled; hospital admission is indicated although death not imminent. 20   -  Very sick; hospital admission necessary; active supportive treatment necessary. 10   - Moribund; fatal processes progressing rapidly. 0     - Dead  Karnofsky DA, Abelmann Groveton, Craver LS and Burchenal JH [phone]) The use of the nitrogen mustards in the palliative treatment of carcinoma: with particular reference to bronchogenic carcinoma Cancer 1 634-56  LABORATORY DATA:  Lab Results  Component Value Date   WBC 7.6 04/14/2021   HGB 14.9 04/14/2021   HCT 43.1 04/14/2021   MCV 86.9 04/14/2021   PLT 262 04/14/2021   Lab Results  Component Value Date   NA 139 04/14/2021   K 3.9 04/14/2021   CL 102 04/14/2021   CO2 28 04/14/2021   Lab Results  Component Value Date   ALT 17 04/14/2021   AST 19 04/14/2021   ALKPHOS 127 (H) 04/14/2021   BILITOT 0.2 (L) 04/14/2021     RADIOGRAPHY: NM Bone Scan Whole Body  Result Date: 07/13/2022 CLINICAL DATA:  PROTEUS STUDY: Prostate carcinoma. Post prostatectomy  EXAM: NUCLEAR MEDICINE WHOLE BODY BONE SCAN TECHNIQUE: Whole body anterior and posterior images were obtained approximately 3 hours after intravenous injection of radiopharmaceutical. RADIOPHARMACEUTICALS:  21.3 mCi Technetium-20mMDP IV COMPARISON:  CT scan 12/21/2020, bone scan 03/24/2020, bone scan 12/21/2020 PCWG2 lesions. 1. No lesions. FINDINGS: No foci of radiotracer accumulation within the axillary appendicular skeleton to suggest bone metastasis. No change from comparison exam. IMPRESSION: No scintigraphic evidence of skeletal metastasis. Electronically Signed   By: SSuzy BouchardM.D.   On: 07/13/2022 12:58   NM PET (PSMA) SKULL TO MID THIGH  Result Date: 07/13/2022 CLINICAL DATA:  Prostate carcinoma with biochemical recurrence. Prior prostatectomy and androgen deprivation therapy. EXAM: NUCLEAR MEDICINE PET SKULL BASE TO THIGH TECHNIQUE: 9.1 mCi F18 Piflufolastat (Pylarify) was injected intravenously. Full-ring PET imaging was performed from the skull base to thigh after the radiotracer. CT data was obtained and used for attenuation correction and anatomic localization. COMPARISON:  11/09/2021 FINDINGS: NECK No radiotracer activity in neck lymph nodes. Incidental CT finding: None. CHEST No radiotracer accumulation within mediastinal or hilar lymph nodes. No suspicious pulmonary nodules on the CT scan. Incidental CT finding: Stable bilateral gynecomastia. ABDOMEN/PELVIS Prostate: Prior prostatectomy. No focal activity in the prostate bed. Lymph nodes: No abnormal radiotracer accumulation within pelvic or abdominal nodes. Liver: No evidence of liver metastasis. Incidental CT finding: Tiny hiatal hernia again noted. Resolution of hepatic steatosis since prior study. Two adjacent paraumbilical ventral hernias are seen which contain only fat, and are unchanged since prior study. SKELETON No focal activity to suggest skeletal metastasis. IMPRESSION: Prior prostatectomy. No evidence of recurrent or  metastatic prostate carcinoma. Electronically Signed   By: JMarlaine HindM.D.   On: 07/13/2022 10:53      IMPRESSION/PLAN: 1. 57y.o. gentleman with biochemically recurrent prostate cancer with a current posttreatment PSA of 0.58 s/p RALP 11/2020 for Stage yp(T2 N0), Gleason 5+4 prostate cancer(PROTEUS). Today we reviewed the findings and workup thus far.  We discussed the natural history of prostate cancer.  We reviewed the the implications of positive margins, extracapsular extension, and seminal vesicle involvement on the risk of prostate cancer recurrence. In his case, he had a very good response to neoadjuvant ADT with only a small foci of remaining adenocarcinoma within the prostate but has a rising, detectable postoperative PSA. We reviewed some of the evidence suggesting an advantage for patients who undergo salvage radiotherapy in this setting in terms of disease control and overall survival. We discussed radiation treatment directed  to the prostatic fossa with regard to the logistics and delivery of daily external beam radiation treatment. We also detailed the role of ST-ADT in the treatment of biochemically recurrent, high risk prostate cancer and outlined the associated side effects that could be expected with this therapy.  At the conclusion of our conversation, the patient is interested in moving forward with the recommended 7.5 week course of daily external beam therapy to the prostate fossa and pelvic lymph nodes, concurrent with ST-ADT. We will share our discussion with Dr. Alinda Money and help to coordinate for a follow-up visit to initiate ADT now, prior to starting treatment. The patient appears to have a good understanding of his disease and our treatment recommendations which are of curative intent and he is in agreement with the stated plan.  He is tentatively scheduled for CT simulation/treatment planning on Friday, August 18, 2022 at 2 PM, in anticipation of beginning IMRT in the near  future.  We enjoyed meeting him today and look forward to continuing to participate in his care.  We personally spent 60 minutes in this encounter including chart review, reviewing radiological studies, meeting face-to-face with the patient, entering orders and completing documentation.     Nicholos Johns, PA-C    Tyler Pita, MD  [facility] Direct Dial: [phone]  Fax: [phone] [email or web address]  Skype  [email or web address]   This document serves as a record of services personally performed by Tyler Pita, MD and Freeman Caldron, PA-C. It was created on their behalf by Wilburn Mylar, a trained medical scribe. The creation of this record is based on the scribe's personal observations and the provider's statements to them. This document has been checked and approved by the attending provider.

## 2022-08-08 NOTE — Consult Note (Signed)
[facility]     08/08/2022   --------------------------------------------------------------------------------   Gordon Scott  MRN: [id number]  DOB: 1966-01-28, 57 year old Male  SSN:    PRIMARY CARE:  Marlena Clipper, NP  PRIMARY CARE FAX:  [phone]  REFERRING:  Rudell Cobb. Marin Olp, MD  PROVIDER:  Raynelle Bring, M.D.  LOCATION:  [facility] [phone]     --------------------------------------------------------------------------------   CC/HPI: CC: Prostate Cancer   PCP: Alfred Levins, NP  Location of consult: [facility]   Mr. Beam is a 57 year old gentleman who was found to have an elevated PSA of 126 in 2021. He underwent a TRUS biopsy of the prostate by Dr. Nila Nephew in [place] and was found to have Gleason 5+4=9 adenocarcinoma with 12 out of 12 biopsy cores positive. Conventional imaging with CT and bone scan imaging were negative for metastatic disease and he screened into the PROTEUS trial. He received 6 months of neoadjuvant ADT +/- placebo beginnin gin October 2021 followed by surgery with a NNS RAL radical prostatectomy and BPLND in May 2022. Pathology indicated a pT2 N0 Mx, Gleason 9 (ADT effect) in only 5% of the tissue with 14 lymph nodes negative. He completed an additional 6 months of adjuvant systemic therapy and then stopped. His PSA was undetectable postoperatively and his testosterone was noted to have fully recovered by February 2022 when it was 708. His PSA was undetectable in August 2023 but then increased to 0.24 in November 2023. Per trial protocol, he underwent CT and bone scan imaging as well as PSMA PET imaging in December 2023 all of which were negative. His PSA further increased to 0.58 on 08/02/22.    PMH: He has a history of SVT s/p ablation.     ALLERGIES: NKDA    MEDICATIONS: Cyclobenzaprine Hcl 10 mg tablet For 10 Days  Fenofibrate 145 mg tablet 1 tablet PO  Daily  Juice Plus  Prednisone 10 mg tablet, dose pack 1 tablet PO Daily     GU PSH: Robotic Radical Prostatectomy - 12/06/2020     NON-GU PSH: Additional Spinal Fusion     GU PMH: ED due to arterial insufficiency - 06/06/2022, - 11/22/2021 Prostate Cancer - 06/06/2022, - 02/14/2022, - 11/22/2021, - 07/27/2021, - 05/24/2021, - 04/27/2021, - 03/29/2021, - 03/01/2021, - 02/02/2021, - 01/05/2021, - 12/14/2020, - 11/23/2020, - 10/27/2020, - 10/14/2020, - 09/28/2020, - 08/31/2020, - 07/05/2020, - 05/12/2020, - 04/06/2020 Stress Incontinence - 06/06/2022, - 04/26/2021, - 03/29/2021, - 02/02/2021, - 01/13/2021, - 12/31/2020, - 11/01/2020 Left testicular pain - 06/02/2020      PMH Notes:   1) Prostate cancer: He was diagnosed with very high risk prostate cancer in August 2021 with a PSA of 126 and 12 out of 12 biopsy cores positive for Gleason 4+5=9 adenocarcinoma. Conventional imaging studies were negative. His insurance company denied him the option of a PSMA PET scan. He eventually elected surgical therapy in the context of the PROTEUS study. He began systemic androgen directed therapy in October 2021. He proceeded with surgical therapy with a NNS RAL radical prostatectomy and BPLND and umbilical hernia repair in May 2022. He completed androgen directed therapy in December 2022.   Diagnosis: pT2 N0 Mx, Gleason 9 (final path with anti-androgen effect and unable to grade) with negative surgical margins (only 5% involvement, 0/14 lymph nodes involved)  Pretreatment PSA: 126  Pretreatment SHIM score: 24       NON-GU PMH: No  Non-GU PMH    FAMILY HISTORY: 1 Daughter - Daughter 4 sons - Son   SOCIAL HISTORY: Marital Status: Married Preferred Language: English; Ethnicity: Not Hispanic Or Latino; Race: White Current Smoking Status: Patient has never smoked.   Tobacco Use Assessment Completed: Used Tobacco in last 30 days? Social Drinker.  Drinks 3 caffeinated drinks per day.    REVIEW OF SYSTEMS:    GU Review  Male:   Patient denies frequent urination, hard to postpone urination, burning/ pain with urination, get up at night to urinate, leakage of urine, stream starts and stops, trouble starting your streams, and have to strain to urinate .  Gastrointestinal (Lower):   Patient denies diarrhea and constipation.  Gastrointestinal (Upper):   Patient denies nausea and vomiting.  Constitutional:   Patient denies fever, night sweats, weight loss, and fatigue.  Skin:   Patient denies skin rash/ lesion and itching.  Eyes:   Patient denies blurred vision and double vision.  Ears/ Nose/ Throat:   Patient denies sore throat and sinus problems.  Hematologic/Lymphatic:   Patient denies swollen glands and easy bruising.  Cardiovascular:   Patient denies leg swelling and chest pains.  Respiratory:   Patient denies cough and shortness of breath.  Endocrine:   Patient denies excessive thirst.  Musculoskeletal:   Patient denies back pain and joint pain.  Neurological:   Patient denies headaches and dizziness.  Psychologic:   Patient denies depression and anxiety.   VITAL SIGNS: None   MULTI-SYSTEM PHYSICAL EXAMINATION:    Constitutional: Well-nourished. No physical deformities. Normally developed. Good grooming.     Complexity of Data:  Lab Test Review:   PSA  X-Ray Review: PET- PSMA Scan: Reviewed Films.     06/08/22 02/14/22 07/27/21 05/24/21 03/30/21  PSA  Total PSA 0.24 ng/ml < 0.02 ng/ml <0.015 ng/mL < 0.02 ng/ml < 0.02 ng/ml    08/24/20  Hormones  Testosterone, Total 708.3 ng/dL   Notes:                     CLINICAL DATA: Prostate carcinoma with biochemical recurrence.  Prior prostatectomy and androgen deprivation therapy.   EXAM:  NUCLEAR MEDICINE PET SKULL BASE TO THIGH   TECHNIQUE:  9.1 mCi F18 Piflufolastat (Pylarify) was injected intravenously.  Full-ring PET imaging was performed from the skull base to thigh  after the radiotracer. CT data was obtained and used for attenuation   correction and anatomic localization.   COMPARISON: 11/09/2021   FINDINGS:  NECK   No radiotracer activity in neck lymph nodes.   Incidental CT finding: None.   CHEST   No radiotracer accumulation within mediastinal or hilar lymph nodes.  No suspicious pulmonary nodules on the CT scan.   Incidental CT finding: Stable bilateral gynecomastia.   ABDOMEN/PELVIS   Prostate: Prior prostatectomy. No focal activity in the prostate  bed.   Lymph nodes: No abnormal radiotracer accumulation within pelvic or  abdominal nodes.   Liver: No evidence of liver metastasis.   Incidental CT finding: Tiny hiatal hernia again noted. Resolution of  hepatic steatosis since prior study. Two adjacent paraumbilical  ventral hernias are seen which contain only fat, and are unchanged  since prior study.   SKELETON   No focal activity to suggest skeletal metastasis.   IMPRESSION:  Prior prostatectomy. No evidence of recurrent or metastatic prostate  carcinoma.    Electronically Signed  By: Marlaine Hind M.D.  On: 07/13/2022 10:53  PROCEDURES: None   ASSESSMENT:  ICD-10 Details  1 GU:   Prostate Cancer - C61    PLAN:           Document Letter(s):  Created for Patient: Clinical Summary         Notes:   1. Biochemically recurrent prostate cancer: I had a long detailed discussion with Richardson Landry he and his wife today regarding his situation. We reviewed his recent imaging studies including his conventional imaging studies and his PSMA PET scan images which do not suggest clear localization of his disease. However, his PSA continues to rise with a relatively rapid PSA doubling time. Considering his young age and life expectancy, I have strongly recommended that he consider salvage therapy of curative intent. He will be seeing Dr. Tammi Klippel later this afternoon as well. We have discussed proceeding with short-term androgen deprivation therapy and salvage radiation therapy to the prostatic fossa.  He will be scheduled to come in for to begin treatment with Eligard 45 mg and his subsequent follow-ups will be scheduled per our research department for ongoing monitoring on the Proteus trial.   CC: Marlena Clipper, NP  Dr. Tyler Pita

## 2022-08-09 ENCOUNTER — Encounter: Payer: Self-pay | Admitting: Genetic Counselor

## 2022-08-09 NOTE — Progress Notes (Signed)
REFERRING PROVIDER: Tyler Pita, MD  PRIMARY PROVIDER:  [facility]  PRIMARY REASON FOR VISIT:  1. Prostate cancer ([place])    HISTORY OF PRESENT ILLNESS:   Gordon Scott, a 57 y.o. male, was seen for a Villan[facility]va cancer genetics consultation at the request of Gordon Scott due to a personal history of cancer.  Gordon Scott presents to clinic today to discuss the possibility of a hereditary predisposition to cancer, to discuss genetic testing, and to further clarify his future cancer risks, as well as potential cancer risks for family members.   Gordon Scott was diagnosed with prostate cancer at age 61, Gleason score 9.   CANCER HISTORY:  Oncology History  Prostate cancer ([place])  03/01/2020 Cancer Staging   Staging form: Prostate, AJCC 8th Edition - Clinical stage from 03/01/2020: Stage IIIC (cT1c, cN0, cM0, PSA: 126, Grade Group: 5) - Signed by Gordon Caldron, PA-C on 08/08/2022 Histopathologic type: Adenocarcinoma, NOS Stage prefix: Initial diagnosis Prostate specific antigen (PSA) range: 20 or greater Gleason primary pattern: 5 Gleason secondary pattern: 4 Gleason score: 9 Histologic grading system: 5 grade system Number of biopsy cores examined: 12 Number of biopsy cores positive: 12 Location of positive needle core biopsies: Both sides   03/26/2020 Initial Diagnosis   Prostate cancer ([place])   12/06/2020 Cancer Staging   Staging form: Prostate, AJCC 8th Edition - Pathologic stage from 12/06/2020: Stage IIIC (ypT2, pN0, cM0, PSA: 126, Grade Group: 5) - Signed by Gordon Caldron, PA-C on 08/08/2022 Histopathologic type: Adenocarcinoma, NOS Stage prefix: Post-therapy Response to neoadjuvant therapy: Partial response Prostate specific antigen (PSA) range: 20 or greater Gleason primary pattern: 5 Gleason secondary pattern: 4 Gleason score: 9 Histologic grading system: 5 grade system Residual tumor (R): R0 - None     Past Medical History:   Diagnosis Date   COVID-19 virus infection 04/29/2019   Last Assessment & Plan:  Asymptomatic preprocedural Covid screen positive Patient is not requiring any oxygen at present time Patient remains asymptomatic, D-dimer < 500. Advanced droplet precautions.  - Patient does not meet criteria for remdesivir or Decadron per Hospital treatment alogarithm  -- Supportive treatment  -- Will continue to Monitor.   Dysrhythmia    SVT- 3 years ago ablation done in [place]- hadfollowup then no longer sees cardiologist    Goals of care, counseling/discussion 03/26/2020   Prostate cancer ([place]) 03/26/2020   Sleep apnea    mild , cpap     Past Surgical History:  Procedure Laterality Date   ABLATION     LYMPHADENECTOMY Bilateral 12/06/2020   Procedure: LYMPHADENECTOMY, PELVIC BILATERAL;  Surgeon: Raynelle Bring, MD;  Location: [facility];  Service: Urology;  Laterality: Bilateral;   ROBOT ASSISTED LAPAROSCOPIC RADICAL PROSTATECTOMY N/A 12/06/2020   Procedure: XI ROBOTIC ASSISTED LAPAROSCOPIC RADICAL PROSTATECTOMY LEVEL 2, UMBILICAL HERNIA REPAIR;  Surgeon: Raynelle Bring, MD;  Location: [facility];  Service: Urology;  Laterality: N/A;   SPINAL FUSION      Social History   Socioeconomic History   Marital status: Married    Spouse name: Not on file   Number of children: Not on file   Years of education: Not on file   Highest education level: Not on file  Occupational History   Not on file  Tobacco Use   Smoking status: Never   Smokeless tobacco: Former    Types: Snuff    Quit date: 05/12/2017  Vaping Use   Vaping Use: Never used  Substance and Sexual Activity  Alcohol use: Never   Drug use: Never   Sexual activity: Not on file  Other Topics Concern   Not on file  Social History Narrative   Not on file   Social Determinants of Health   Financial Resource Strain: Not on file  Food Insecurity: Not on file  Transportation Needs: Not on file  Physical Activity: Not on file  Stress: Not on file   Social Connections: Not on file     FAMILY HISTORY:  We obtained a detailed, 4-generation family history.  Significant diagnoses are listed below:  Gordon Scott reports his father was diagnosed with an unknown type of cancer, he died at age 44. Gordon Scott has no information about his paternal family medical history     GENETIC COUNSELING ASSESSMENT: Gordon Scott is a 57 y.o. male with a personal history of cancer which is somewhat suggestive of a hereditary predisposition to cancer given his high risk prostate cancer. We, therefore, discussed and recommended the following at today's visit.   DISCUSSION: We discussed that 5 - 10% of cancer is hereditary, with most cases of prostate cancer associated with BRCA1/2.  There are other genes that can be associated with hereditary prostate cancer syndromes.  We discussed that testing is beneficial for several reasons including knowing how to follow individuals after completing their treatment, identifying whether potential treatment options would be beneficial, and understanding if other family members could be at risk for cancer and allowing them to undergo genetic testing.   We reviewed the characteristics, features and inheritance patterns of hereditary cancer syndromes. We also discussed genetic testing, including the appropriate family members to test, the process of testing, insurance coverage and turn-around-time for results. We discussed the implications of a negative, positive, carrier and/or variant of uncertain significant result. We recommended Gordon Scott pursue genetic testing for a panel that includes genes associated with prostate cancer.   Based on Gordon Scott personal history of cancer, he meets medical criteria for genetic testing. Despite that he meets criteria, he may still have an out of pocket cost. We discussed that if his out of pocket cost for testing is over $100, the laboratory will call and confirm whether he wants to proceed with  testing.  If the out of pocket cost of testing is less than $100 he will be billed by the genetic testing laboratory.   PLAN: Despite our recommendation, Gordon Scott did not wish to pursue genetic testing at today's visit. We understand this decision and remain available to coordinate genetic testing at any time in the future. We, therefore, recommend Mr. Colpitts continue to follow the cancer screening guidelines given by his primary healthcare provider.  Mr. Fritze questions were answered to his satisfaction today. Our contact information was provided should additional questions or concerns arise. Thank you for the referral and allowing [place] to share in the care of your patient.   Lucille Passy, MS, [facility] Genetic Counselor [email or web address] (P) [phone]  The patient was seen for a total of <15 minutes in face-to-face genetic counseling.  The patient brought his wife. Drs. Lindi Adie and/or Burr Medico were available to discuss this case as needed.   _______________________________________________________________________ For Office Staff:  Number of people involved in session: 2 Was an Intern/ student involved with case: no

## 2022-08-14 NOTE — Progress Notes (Signed)
Patient presented to the [facility] on 08/08/22 for his biochemical recurrent prostate cancer with a current posttreatment PSA of 0.58 s/p RALP 11/2020 for Stage yp(T2 N0), Gleason 5+4 prostate cancer(PROTEUS).   Patient was in agreement to proceed with ST ADT followed by salvage radiation.  Patient is scheduled to receive his ADT @ [facility] on 1/30 and his CT Simulation on 2/2.   RN spoke with wife, they are aware of both these appointments.  Denies any additional needs at this time.    Plan of care in progress.

## 2022-08-17 NOTE — Progress Notes (Signed)
  Radiation Oncology         [phone] ________________________________  Name: Gordon Scott MRN: [id number]  Date: 08/18/2022  DOB: [birth year]  SIMULATION AND TREATMENT PLANNING NOTE    ICD-10-CM   1. Prostate cancer [facility])  C61       DIAGNOSIS:  58 y.o. gentleman with biochemically recurrent prostate cancer with a current posttreatment PSA of 0.58 s/p RALP 11/2020 for Stage yp(T2 N0), Gleason 5+4 prostate cancer(PROTEUS).   NARRATIVE:  The patient was brought to the [facility].  Identity was confirmed.  All relevant records and images related to the planned course of therapy were reviewed.  The patient freely provided informed written consent to proceed with treatment after reviewing the details related to the planned course of therapy. The consent form was witnessed and verified by the simulation staff.  Then, the patient was set-up in a stable reproducible supine position for radiation therapy.  A vacuum lock pillow device was custom fabricated to position his legs in a reproducible immobilized position.  Then, I performed a urethrogram under sterile conditions to identify the prostatic apex.  CT images were obtained.  Surface markings were placed.  The CT images were loaded into the planning software.  Then the prostate target and avoidance structures including the rectum, bladder, bowel and hips were contoured.  Treatment planning then occurred.  The radiation prescription was entered and confirmed.  A total of one complex treatment devices was fabricated. I have requested : Intensity Modulated Radiotherapy (IMRT) is medically necessary for this case for the following reason:  Rectal sparing.Marland Kitchen  PLAN:   The prostate fossa and pelvic lymph nodes will initially be treated to 45 Gy in 25 fractions of 1.8 Gy followed by a boost to the fossa only, to 68.4 Gy with 13 additional fractions of 1.8 Gy   ________________________________  Sheral Apley Tammi Klippel, M.D.

## 2022-08-18 ENCOUNTER — Ambulatory Visit
Admission: RE | Admit: 2022-08-18 | Discharge: 2022-08-18 | Disposition: A | Discharge status: Home / Self Care | Payer: BC Managed Care – PPO | Source: Ambulatory Visit | Attending: Radiation Oncology | Admitting: Radiation Oncology

## 2022-08-18 DIAGNOSIS — C61 Malignant neoplasm of prostate: Secondary | ICD-10-CM | POA: Insufficient documentation

## 2022-08-18 DIAGNOSIS — Z510 Encounter for antineoplastic radiation therapy: Secondary | ICD-10-CM | POA: Diagnosis not present

## 2022-08-21 DIAGNOSIS — Z510 Encounter for antineoplastic radiation therapy: Secondary | ICD-10-CM | POA: Diagnosis not present

## 2022-08-30 ENCOUNTER — Other Ambulatory Visit: Payer: Self-pay

## 2022-08-30 ENCOUNTER — Ambulatory Visit
Admission: RE | Admit: 2022-08-30 | Discharge: 2022-08-30 | Disposition: A | Discharge status: Home / Self Care | Payer: BC Managed Care – PPO | Source: Ambulatory Visit | Attending: Radiation Oncology | Admitting: Radiation Oncology

## 2022-08-30 DIAGNOSIS — Z510 Encounter for antineoplastic radiation therapy: Secondary | ICD-10-CM | POA: Diagnosis not present

## 2022-08-30 LAB — RAD ONC ARIA SESSION SUMMARY
Course Elapsed Days: 0
Plan Fractions Treated to Date: 1
Plan Prescribed Dose Per Fraction: 1.8 Gy
Plan Total Fractions Prescribed: 25
Plan Total Prescribed Dose: 45 Gy
Reference Point Dosage Given to Date: 1.8 Gy
Reference Point Session Dosage Given: 1.8 Gy
Session Number: 1

## 2022-08-31 ENCOUNTER — Other Ambulatory Visit: Payer: Self-pay

## 2022-08-31 ENCOUNTER — Ambulatory Visit
Admission: RE | Admit: 2022-08-31 | Discharge: 2022-08-31 | Disposition: A | Discharge status: Home / Self Care | Payer: BC Managed Care – PPO | Source: Ambulatory Visit | Attending: Radiation Oncology | Admitting: Radiation Oncology

## 2022-08-31 DIAGNOSIS — Z510 Encounter for antineoplastic radiation therapy: Secondary | ICD-10-CM | POA: Diagnosis not present

## 2022-08-31 LAB — RAD ONC ARIA SESSION SUMMARY
Course Elapsed Days: 1
Plan Fractions Treated to Date: 2
Plan Prescribed Dose Per Fraction: 1.8 Gy
Plan Total Fractions Prescribed: 25
Plan Total Prescribed Dose: 45 Gy
Reference Point Dosage Given to Date: 3.6 Gy
Reference Point Session Dosage Given: 1.8 Gy
Session Number: 2

## 2022-09-01 ENCOUNTER — Other Ambulatory Visit: Payer: Self-pay

## 2022-09-01 ENCOUNTER — Ambulatory Visit
Admission: RE | Admit: 2022-09-01 | Discharge: 2022-09-01 | Disposition: A | Discharge status: Home / Self Care | Payer: BC Managed Care – PPO | Source: Ambulatory Visit | Attending: Radiation Oncology | Admitting: Radiation Oncology

## 2022-09-01 DIAGNOSIS — Z510 Encounter for antineoplastic radiation therapy: Secondary | ICD-10-CM | POA: Diagnosis not present

## 2022-09-01 LAB — RAD ONC ARIA SESSION SUMMARY
Course Elapsed Days: 2
Plan Fractions Treated to Date: 3
Plan Prescribed Dose Per Fraction: 1.8 Gy
Plan Total Fractions Prescribed: 25
Plan Total Prescribed Dose: 45 Gy
Reference Point Dosage Given to Date: 5.4 Gy
Reference Point Session Dosage Given: 1.8 Gy
Session Number: 3

## 2022-09-04 ENCOUNTER — Ambulatory Visit
Admission: RE | Admit: 2022-09-04 | Discharge: 2022-09-04 | Disposition: A | Discharge status: Home / Self Care | Payer: BC Managed Care – PPO | Source: Ambulatory Visit | Attending: Radiation Oncology | Admitting: Radiation Oncology

## 2022-09-04 ENCOUNTER — Other Ambulatory Visit: Payer: Self-pay

## 2022-09-04 DIAGNOSIS — Z510 Encounter for antineoplastic radiation therapy: Secondary | ICD-10-CM | POA: Diagnosis not present

## 2022-09-04 LAB — RAD ONC ARIA SESSION SUMMARY
Course Elapsed Days: 5
Plan Fractions Treated to Date: 4
Plan Prescribed Dose Per Fraction: 1.8 Gy
Plan Total Fractions Prescribed: 25
Plan Total Prescribed Dose: 45 Gy
Reference Point Dosage Given to Date: 7.2 Gy
Reference Point Session Dosage Given: 1.8 Gy
Session Number: 4

## 2022-09-05 ENCOUNTER — Other Ambulatory Visit: Payer: Self-pay

## 2022-09-05 ENCOUNTER — Ambulatory Visit
Admission: RE | Admit: 2022-09-05 | Discharge: 2022-09-05 | Disposition: A | Discharge status: Home / Self Care | Payer: BC Managed Care – PPO | Source: Ambulatory Visit | Attending: Radiation Oncology | Admitting: Radiation Oncology

## 2022-09-05 DIAGNOSIS — Z510 Encounter for antineoplastic radiation therapy: Secondary | ICD-10-CM | POA: Diagnosis not present

## 2022-09-05 LAB — RAD ONC ARIA SESSION SUMMARY
Course Elapsed Days: 6
Plan Fractions Treated to Date: 5
Plan Prescribed Dose Per Fraction: 1.8 Gy
Plan Total Fractions Prescribed: 25
Plan Total Prescribed Dose: 45 Gy
Reference Point Dosage Given to Date: 9 Gy
Reference Point Session Dosage Given: 1.8 Gy
Session Number: 5

## 2022-09-06 ENCOUNTER — Ambulatory Visit
Admission: RE | Admit: 2022-09-06 | Discharge: 2022-09-06 | Disposition: A | Discharge status: Home / Self Care | Payer: BC Managed Care – PPO | Source: Ambulatory Visit | Attending: Radiation Oncology | Admitting: Radiation Oncology

## 2022-09-06 ENCOUNTER — Other Ambulatory Visit: Payer: Self-pay

## 2022-09-06 DIAGNOSIS — Z510 Encounter for antineoplastic radiation therapy: Secondary | ICD-10-CM | POA: Diagnosis not present

## 2022-09-06 LAB — RAD ONC ARIA SESSION SUMMARY
Course Elapsed Days: 7
Plan Fractions Treated to Date: 6
Plan Prescribed Dose Per Fraction: 1.8 Gy
Plan Total Fractions Prescribed: 25
Plan Total Prescribed Dose: 45 Gy
Reference Point Dosage Given to Date: 10.8 Gy
Reference Point Session Dosage Given: 1.8 Gy
Session Number: 6

## 2022-09-07 ENCOUNTER — Ambulatory Visit
Admission: RE | Admit: 2022-09-07 | Discharge: 2022-09-07 | Disposition: A | Discharge status: Home / Self Care | Payer: BC Managed Care – PPO | Source: Ambulatory Visit | Attending: Radiation Oncology | Admitting: Radiation Oncology

## 2022-09-07 ENCOUNTER — Other Ambulatory Visit: Payer: Self-pay

## 2022-09-07 DIAGNOSIS — Z510 Encounter for antineoplastic radiation therapy: Secondary | ICD-10-CM | POA: Diagnosis not present

## 2022-09-07 LAB — RAD ONC ARIA SESSION SUMMARY
Course Elapsed Days: 8
Plan Fractions Treated to Date: 7
Plan Prescribed Dose Per Fraction: 1.8 Gy
Plan Total Fractions Prescribed: 25
Plan Total Prescribed Dose: 45 Gy
Reference Point Dosage Given to Date: 12.6 Gy
Reference Point Session Dosage Given: 1.8 Gy
Session Number: 7

## 2022-09-08 ENCOUNTER — Other Ambulatory Visit: Payer: Self-pay

## 2022-09-08 ENCOUNTER — Ambulatory Visit
Admission: RE | Admit: 2022-09-08 | Discharge: 2022-09-08 | Disposition: A | Discharge status: Home / Self Care | Payer: BC Managed Care – PPO | Source: Ambulatory Visit | Attending: Radiation Oncology | Admitting: Radiation Oncology

## 2022-09-08 DIAGNOSIS — Z510 Encounter for antineoplastic radiation therapy: Secondary | ICD-10-CM | POA: Diagnosis not present

## 2022-09-08 LAB — RAD ONC ARIA SESSION SUMMARY
Course Elapsed Days: 9
Plan Fractions Treated to Date: 8
Plan Prescribed Dose Per Fraction: 1.8 Gy
Plan Total Fractions Prescribed: 25
Plan Total Prescribed Dose: 45 Gy
Reference Point Dosage Given to Date: 14.4 Gy
Reference Point Session Dosage Given: 1.8 Gy
Session Number: 8

## 2022-09-11 ENCOUNTER — Ambulatory Visit
Admission: RE | Admit: 2022-09-11 | Discharge: 2022-09-11 | Disposition: A | Discharge status: Home / Self Care | Payer: BC Managed Care – PPO | Source: Ambulatory Visit | Attending: Radiation Oncology | Admitting: Radiation Oncology

## 2022-09-11 ENCOUNTER — Other Ambulatory Visit: Payer: Self-pay

## 2022-09-11 DIAGNOSIS — Z510 Encounter for antineoplastic radiation therapy: Secondary | ICD-10-CM | POA: Diagnosis not present

## 2022-09-11 LAB — RAD ONC ARIA SESSION SUMMARY
Course Elapsed Days: 12
Plan Fractions Treated to Date: 9
Plan Prescribed Dose Per Fraction: 1.8 Gy
Plan Total Fractions Prescribed: 25
Plan Total Prescribed Dose: 45 Gy
Reference Point Dosage Given to Date: 16.2 Gy
Reference Point Session Dosage Given: 1.8 Gy
Session Number: 9

## 2022-09-12 ENCOUNTER — Other Ambulatory Visit: Payer: Self-pay

## 2022-09-12 ENCOUNTER — Ambulatory Visit
Admission: RE | Admit: 2022-09-12 | Discharge: 2022-09-12 | Disposition: A | Discharge status: Home / Self Care | Payer: BC Managed Care – PPO | Source: Ambulatory Visit | Attending: Radiation Oncology | Admitting: Radiation Oncology

## 2022-09-12 DIAGNOSIS — Z510 Encounter for antineoplastic radiation therapy: Secondary | ICD-10-CM | POA: Diagnosis not present

## 2022-09-12 LAB — RAD ONC ARIA SESSION SUMMARY
Course Elapsed Days: 13
Plan Fractions Treated to Date: 10
Plan Prescribed Dose Per Fraction: 1.8 Gy
Plan Total Fractions Prescribed: 25
Plan Total Prescribed Dose: 45 Gy
Reference Point Dosage Given to Date: 18 Gy
Reference Point Session Dosage Given: 1.8 Gy
Session Number: 10

## 2022-09-13 ENCOUNTER — Ambulatory Visit
Admission: RE | Admit: 2022-09-13 | Discharge: 2022-09-13 | Disposition: A | Discharge status: Home / Self Care | Payer: BC Managed Care – PPO | Source: Ambulatory Visit | Attending: Radiation Oncology | Admitting: Radiation Oncology

## 2022-09-13 ENCOUNTER — Other Ambulatory Visit: Payer: Self-pay

## 2022-09-13 DIAGNOSIS — Z510 Encounter for antineoplastic radiation therapy: Secondary | ICD-10-CM | POA: Diagnosis not present

## 2022-09-13 LAB — RAD ONC ARIA SESSION SUMMARY
Course Elapsed Days: 14
Plan Fractions Treated to Date: 11
Plan Prescribed Dose Per Fraction: 1.8 Gy
Plan Total Fractions Prescribed: 25
Plan Total Prescribed Dose: 45 Gy
Reference Point Dosage Given to Date: 19.8 Gy
Reference Point Session Dosage Given: 1.8 Gy
Session Number: 11

## 2022-09-14 ENCOUNTER — Ambulatory Visit
Admission: RE | Admit: 2022-09-14 | Discharge: 2022-09-14 | Disposition: A | Discharge status: Home / Self Care | Payer: BC Managed Care – PPO | Source: Ambulatory Visit | Attending: Radiation Oncology | Admitting: Radiation Oncology

## 2022-09-14 ENCOUNTER — Other Ambulatory Visit: Payer: Self-pay

## 2022-09-14 DIAGNOSIS — Z510 Encounter for antineoplastic radiation therapy: Secondary | ICD-10-CM | POA: Diagnosis not present

## 2022-09-14 LAB — RAD ONC ARIA SESSION SUMMARY
Course Elapsed Days: 15
Plan Fractions Treated to Date: 12
Plan Prescribed Dose Per Fraction: 1.8 Gy
Plan Total Fractions Prescribed: 25
Plan Total Prescribed Dose: 45 Gy
Reference Point Dosage Given to Date: 21.6 Gy
Reference Point Session Dosage Given: 1.8 Gy
Session Number: 12

## 2022-09-15 ENCOUNTER — Ambulatory Visit: Payer: BC Managed Care – PPO

## 2022-09-18 ENCOUNTER — Ambulatory Visit
Admission: RE | Admit: 2022-09-18 | Discharge: 2022-09-18 | Disposition: A | Discharge status: Home / Self Care | Payer: BC Managed Care – PPO | Source: Ambulatory Visit | Attending: Radiation Oncology | Admitting: Radiation Oncology

## 2022-09-18 ENCOUNTER — Other Ambulatory Visit: Payer: Self-pay

## 2022-09-18 DIAGNOSIS — Z510 Encounter for antineoplastic radiation therapy: Secondary | ICD-10-CM | POA: Diagnosis not present

## 2022-09-18 DIAGNOSIS — C61 Malignant neoplasm of prostate: Secondary | ICD-10-CM | POA: Insufficient documentation

## 2022-09-18 LAB — RAD ONC ARIA SESSION SUMMARY
Course Elapsed Days: 19
Plan Fractions Treated to Date: 13
Plan Prescribed Dose Per Fraction: 1.8 Gy
Plan Total Fractions Prescribed: 25
Plan Total Prescribed Dose: 45 Gy
Reference Point Dosage Given to Date: 23.4 Gy
Reference Point Session Dosage Given: 1.8 Gy
Session Number: 13

## 2022-09-19 ENCOUNTER — Ambulatory Visit
Admission: RE | Admit: 2022-09-19 | Discharge: 2022-09-19 | Disposition: A | Discharge status: Home / Self Care | Payer: BC Managed Care – PPO | Source: Ambulatory Visit | Attending: Radiation Oncology | Admitting: Radiation Oncology

## 2022-09-19 ENCOUNTER — Other Ambulatory Visit: Payer: Self-pay

## 2022-09-19 DIAGNOSIS — Z510 Encounter for antineoplastic radiation therapy: Secondary | ICD-10-CM | POA: Diagnosis not present

## 2022-09-19 LAB — RAD ONC ARIA SESSION SUMMARY
Course Elapsed Days: 20
Plan Fractions Treated to Date: 14
Plan Prescribed Dose Per Fraction: 1.8 Gy
Plan Total Fractions Prescribed: 25
Plan Total Prescribed Dose: 45 Gy
Reference Point Dosage Given to Date: 25.2 Gy
Reference Point Session Dosage Given: 1.8 Gy
Session Number: 14

## 2022-09-20 ENCOUNTER — Other Ambulatory Visit: Payer: Self-pay

## 2022-09-20 ENCOUNTER — Ambulatory Visit
Admission: RE | Admit: 2022-09-20 | Discharge: 2022-09-20 | Disposition: A | Discharge status: Home / Self Care | Payer: BC Managed Care – PPO | Source: Ambulatory Visit | Attending: Radiation Oncology | Admitting: Radiation Oncology

## 2022-09-20 DIAGNOSIS — Z510 Encounter for antineoplastic radiation therapy: Secondary | ICD-10-CM | POA: Diagnosis not present

## 2022-09-20 LAB — RAD ONC ARIA SESSION SUMMARY
Course Elapsed Days: 21
Plan Fractions Treated to Date: 15
Plan Prescribed Dose Per Fraction: 1.8 Gy
Plan Total Fractions Prescribed: 25
Plan Total Prescribed Dose: 45 Gy
Reference Point Dosage Given to Date: 27 Gy
Reference Point Session Dosage Given: 1.8 Gy
Session Number: 15

## 2022-09-21 ENCOUNTER — Other Ambulatory Visit: Payer: Self-pay

## 2022-09-21 ENCOUNTER — Ambulatory Visit
Admission: RE | Admit: 2022-09-21 | Discharge: 2022-09-21 | Disposition: A | Discharge status: Home / Self Care | Payer: BC Managed Care – PPO | Source: Ambulatory Visit | Attending: Radiation Oncology | Admitting: Radiation Oncology

## 2022-09-21 DIAGNOSIS — Z510 Encounter for antineoplastic radiation therapy: Secondary | ICD-10-CM | POA: Diagnosis not present

## 2022-09-21 LAB — RAD ONC ARIA SESSION SUMMARY
Course Elapsed Days: 22
Plan Fractions Treated to Date: 16
Plan Prescribed Dose Per Fraction: 1.8 Gy
Plan Total Fractions Prescribed: 25
Plan Total Prescribed Dose: 45 Gy
Reference Point Dosage Given to Date: 28.8 Gy
Reference Point Session Dosage Given: 1.8 Gy
Session Number: 16

## 2022-09-22 ENCOUNTER — Ambulatory Visit
Admission: RE | Admit: 2022-09-22 | Discharge: 2022-09-22 | Disposition: A | Discharge status: Home / Self Care | Payer: BC Managed Care – PPO | Source: Ambulatory Visit | Attending: Radiation Oncology | Admitting: Radiation Oncology

## 2022-09-22 ENCOUNTER — Other Ambulatory Visit: Payer: Self-pay

## 2022-09-22 DIAGNOSIS — Z510 Encounter for antineoplastic radiation therapy: Secondary | ICD-10-CM | POA: Diagnosis not present

## 2022-09-22 LAB — RAD ONC ARIA SESSION SUMMARY
Course Elapsed Days: 23
Plan Fractions Treated to Date: 17
Plan Prescribed Dose Per Fraction: 1.8 Gy
Plan Total Fractions Prescribed: 25
Plan Total Prescribed Dose: 45 Gy
Reference Point Dosage Given to Date: 30.6 Gy
Reference Point Session Dosage Given: 1.8 Gy
Session Number: 17

## 2022-09-25 ENCOUNTER — Ambulatory Visit
Admission: RE | Admit: 2022-09-25 | Discharge: 2022-09-25 | Disposition: A | Discharge status: Home / Self Care | Payer: BC Managed Care – PPO | Source: Ambulatory Visit | Attending: Radiation Oncology | Admitting: Radiation Oncology

## 2022-09-25 ENCOUNTER — Other Ambulatory Visit: Payer: Self-pay

## 2022-09-25 DIAGNOSIS — Z510 Encounter for antineoplastic radiation therapy: Secondary | ICD-10-CM | POA: Diagnosis not present

## 2022-09-25 LAB — RAD ONC ARIA SESSION SUMMARY
Course Elapsed Days: 26
Plan Fractions Treated to Date: 18
Plan Prescribed Dose Per Fraction: 1.8 Gy
Plan Total Fractions Prescribed: 25
Plan Total Prescribed Dose: 45 Gy
Reference Point Dosage Given to Date: 32.4 Gy
Reference Point Session Dosage Given: 1.8 Gy
Session Number: 18

## 2022-09-25 NOTE — Progress Notes (Signed)
RN left voicemail to assess any needs since starting radiation treatment.

## 2022-09-26 ENCOUNTER — Other Ambulatory Visit: Payer: Self-pay

## 2022-09-26 ENCOUNTER — Ambulatory Visit
Admission: RE | Admit: 2022-09-26 | Discharge: 2022-09-26 | Disposition: A | Discharge status: Home / Self Care | Payer: BC Managed Care – PPO | Source: Ambulatory Visit | Attending: Radiation Oncology | Admitting: Radiation Oncology

## 2022-09-26 DIAGNOSIS — Z510 Encounter for antineoplastic radiation therapy: Secondary | ICD-10-CM | POA: Diagnosis not present

## 2022-09-26 LAB — RAD ONC ARIA SESSION SUMMARY
Course Elapsed Days: 27
Plan Fractions Treated to Date: 19
Plan Prescribed Dose Per Fraction: 1.8 Gy
Plan Total Fractions Prescribed: 25
Plan Total Prescribed Dose: 45 Gy
Reference Point Dosage Given to Date: 34.2 Gy
Reference Point Session Dosage Given: 1.8 Gy
Session Number: 19

## 2022-09-27 ENCOUNTER — Other Ambulatory Visit: Payer: Self-pay

## 2022-09-27 ENCOUNTER — Ambulatory Visit
Admission: RE | Admit: 2022-09-27 | Discharge: 2022-09-27 | Disposition: A | Discharge status: Home / Self Care | Payer: BC Managed Care – PPO | Source: Ambulatory Visit | Attending: Radiation Oncology | Admitting: Radiation Oncology

## 2022-09-27 DIAGNOSIS — Z510 Encounter for antineoplastic radiation therapy: Secondary | ICD-10-CM | POA: Diagnosis not present

## 2022-09-27 LAB — RAD ONC ARIA SESSION SUMMARY
Course Elapsed Days: 28
Plan Fractions Treated to Date: 20
Plan Prescribed Dose Per Fraction: 1.8 Gy
Plan Total Fractions Prescribed: 25
Plan Total Prescribed Dose: 45 Gy
Reference Point Dosage Given to Date: 36 Gy
Reference Point Session Dosage Given: 1.8 Gy
Session Number: 20

## 2022-09-28 ENCOUNTER — Ambulatory Visit: Payer: BC Managed Care – PPO

## 2022-09-28 ENCOUNTER — Other Ambulatory Visit: Payer: Self-pay

## 2022-09-28 ENCOUNTER — Ambulatory Visit
Admission: RE | Admit: 2022-09-28 | Discharge: 2022-09-28 | Disposition: A | Discharge status: Home / Self Care | Payer: BC Managed Care – PPO | Source: Ambulatory Visit | Attending: Radiation Oncology | Admitting: Radiation Oncology

## 2022-09-28 DIAGNOSIS — Z510 Encounter for antineoplastic radiation therapy: Secondary | ICD-10-CM | POA: Diagnosis not present

## 2022-09-28 LAB — RAD ONC ARIA SESSION SUMMARY
Course Elapsed Days: 29
Plan Fractions Treated to Date: 21
Plan Prescribed Dose Per Fraction: 1.8 Gy
Plan Total Fractions Prescribed: 25
Plan Total Prescribed Dose: 45 Gy
Reference Point Dosage Given to Date: 37.8 Gy
Reference Point Session Dosage Given: 1.8 Gy
Session Number: 21

## 2022-09-29 ENCOUNTER — Other Ambulatory Visit: Payer: Self-pay

## 2022-09-29 ENCOUNTER — Ambulatory Visit
Admission: RE | Admit: 2022-09-29 | Discharge: 2022-09-29 | Disposition: A | Discharge status: Home / Self Care | Payer: BC Managed Care – PPO | Source: Ambulatory Visit | Attending: Radiation Oncology | Admitting: Radiation Oncology

## 2022-09-29 DIAGNOSIS — Z510 Encounter for antineoplastic radiation therapy: Secondary | ICD-10-CM | POA: Diagnosis not present

## 2022-09-29 LAB — RAD ONC ARIA SESSION SUMMARY
Course Elapsed Days: 30
Plan Fractions Treated to Date: 22
Plan Prescribed Dose Per Fraction: 1.8 Gy
Plan Total Fractions Prescribed: 25
Plan Total Prescribed Dose: 45 Gy
Reference Point Dosage Given to Date: 39.6 Gy
Reference Point Session Dosage Given: 1.8 Gy
Session Number: 22

## 2022-10-02 ENCOUNTER — Ambulatory Visit
Admission: RE | Admit: 2022-10-02 | Discharge: 2022-10-02 | Disposition: A | Discharge status: Home / Self Care | Payer: BC Managed Care – PPO | Source: Ambulatory Visit | Attending: Radiation Oncology | Admitting: Radiation Oncology

## 2022-10-02 ENCOUNTER — Other Ambulatory Visit: Payer: Self-pay

## 2022-10-02 DIAGNOSIS — Z510 Encounter for antineoplastic radiation therapy: Secondary | ICD-10-CM | POA: Diagnosis not present

## 2022-10-02 LAB — RAD ONC ARIA SESSION SUMMARY
Course Elapsed Days: 33
Plan Fractions Treated to Date: 23
Plan Prescribed Dose Per Fraction: 1.8 Gy
Plan Total Fractions Prescribed: 25
Plan Total Prescribed Dose: 45 Gy
Reference Point Dosage Given to Date: 41.4 Gy
Reference Point Session Dosage Given: 1.8 Gy
Session Number: 23

## 2022-10-03 ENCOUNTER — Other Ambulatory Visit: Payer: Self-pay

## 2022-10-03 ENCOUNTER — Ambulatory Visit
Admission: RE | Admit: 2022-10-03 | Discharge: 2022-10-03 | Disposition: A | Discharge status: Home / Self Care | Payer: BC Managed Care – PPO | Source: Ambulatory Visit | Attending: Radiation Oncology | Admitting: Radiation Oncology

## 2022-10-03 DIAGNOSIS — Z510 Encounter for antineoplastic radiation therapy: Secondary | ICD-10-CM | POA: Diagnosis not present

## 2022-10-03 LAB — RAD ONC ARIA SESSION SUMMARY
Course Elapsed Days: 34
Plan Fractions Treated to Date: 24
Plan Prescribed Dose Per Fraction: 1.8 Gy
Plan Total Fractions Prescribed: 25
Plan Total Prescribed Dose: 45 Gy
Reference Point Dosage Given to Date: 43.2 Gy
Reference Point Session Dosage Given: 1.8 Gy
Session Number: 24

## 2022-10-04 ENCOUNTER — Ambulatory Visit: Payer: BC Managed Care – PPO

## 2022-10-04 ENCOUNTER — Other Ambulatory Visit: Payer: Self-pay

## 2022-10-04 DIAGNOSIS — Z510 Encounter for antineoplastic radiation therapy: Secondary | ICD-10-CM | POA: Diagnosis not present

## 2022-10-04 LAB — RAD ONC ARIA SESSION SUMMARY
Course Elapsed Days: 35
Plan Fractions Treated to Date: 25
Plan Prescribed Dose Per Fraction: 1.8 Gy
Plan Total Fractions Prescribed: 25
Plan Total Prescribed Dose: 45 Gy
Reference Point Dosage Given to Date: 45 Gy
Reference Point Session Dosage Given: 1.8 Gy
Session Number: 25

## 2022-10-05 ENCOUNTER — Ambulatory Visit: Payer: BC Managed Care – PPO

## 2022-10-05 ENCOUNTER — Other Ambulatory Visit: Payer: Self-pay

## 2022-10-05 DIAGNOSIS — Z510 Encounter for antineoplastic radiation therapy: Secondary | ICD-10-CM | POA: Diagnosis not present

## 2022-10-05 LAB — RAD ONC ARIA SESSION SUMMARY
Course Elapsed Days: 36
Plan Fractions Treated to Date: 1
Plan Prescribed Dose Per Fraction: 1.8 Gy
Plan Total Fractions Prescribed: 13
Plan Total Prescribed Dose: 23.4 Gy
Reference Point Dosage Given to Date: 1.8 Gy
Reference Point Session Dosage Given: 1.8 Gy
Session Number: 26

## 2022-10-06 ENCOUNTER — Other Ambulatory Visit: Payer: Self-pay

## 2022-10-06 ENCOUNTER — Ambulatory Visit
Admission: RE | Admit: 2022-10-06 | Discharge: 2022-10-06 | Disposition: A | Discharge status: Home / Self Care | Payer: BC Managed Care – PPO | Source: Ambulatory Visit | Attending: Radiation Oncology | Admitting: Radiation Oncology

## 2022-10-06 DIAGNOSIS — Z510 Encounter for antineoplastic radiation therapy: Secondary | ICD-10-CM | POA: Diagnosis not present

## 2022-10-06 LAB — RAD ONC ARIA SESSION SUMMARY
Course Elapsed Days: 37
Plan Fractions Treated to Date: 2
Plan Prescribed Dose Per Fraction: 1.8 Gy
Plan Total Fractions Prescribed: 13
Plan Total Prescribed Dose: 23.4 Gy
Reference Point Dosage Given to Date: 3.6 Gy
Reference Point Session Dosage Given: 1.8 Gy
Session Number: 27

## 2022-10-09 ENCOUNTER — Other Ambulatory Visit: Payer: Self-pay

## 2022-10-09 ENCOUNTER — Ambulatory Visit
Admission: RE | Admit: 2022-10-09 | Discharge: 2022-10-09 | Disposition: A | Discharge status: Home / Self Care | Payer: BC Managed Care – PPO | Source: Ambulatory Visit | Attending: Radiation Oncology | Admitting: Radiation Oncology

## 2022-10-09 DIAGNOSIS — Z510 Encounter for antineoplastic radiation therapy: Secondary | ICD-10-CM | POA: Diagnosis not present

## 2022-10-09 LAB — RAD ONC ARIA SESSION SUMMARY
Course Elapsed Days: 40
Plan Fractions Treated to Date: 3
Plan Prescribed Dose Per Fraction: 1.8 Gy
Plan Total Fractions Prescribed: 13
Plan Total Prescribed Dose: 23.4 Gy
Reference Point Dosage Given to Date: 5.4 Gy
Reference Point Session Dosage Given: 1.8 Gy
Session Number: 28

## 2022-10-10 ENCOUNTER — Other Ambulatory Visit: Payer: Self-pay

## 2022-10-10 ENCOUNTER — Ambulatory Visit
Admission: RE | Admit: 2022-10-10 | Discharge: 2022-10-10 | Disposition: A | Discharge status: Home / Self Care | Payer: BC Managed Care – PPO | Source: Ambulatory Visit | Attending: Radiation Oncology | Admitting: Radiation Oncology

## 2022-10-10 DIAGNOSIS — Z510 Encounter for antineoplastic radiation therapy: Secondary | ICD-10-CM | POA: Diagnosis not present

## 2022-10-10 LAB — RAD ONC ARIA SESSION SUMMARY
Course Elapsed Days: 41
Plan Fractions Treated to Date: 4
Plan Prescribed Dose Per Fraction: 1.8 Gy
Plan Total Fractions Prescribed: 13
Plan Total Prescribed Dose: 23.4 Gy
Reference Point Dosage Given to Date: 7.2 Gy
Reference Point Session Dosage Given: 1.8 Gy
Session Number: 29

## 2022-10-11 ENCOUNTER — Other Ambulatory Visit: Payer: Self-pay

## 2022-10-11 ENCOUNTER — Ambulatory Visit
Admission: RE | Admit: 2022-10-11 | Discharge: 2022-10-11 | Disposition: A | Discharge status: Home / Self Care | Payer: BC Managed Care – PPO | Source: Ambulatory Visit | Attending: Radiation Oncology | Admitting: Radiation Oncology

## 2022-10-11 DIAGNOSIS — Z510 Encounter for antineoplastic radiation therapy: Secondary | ICD-10-CM | POA: Diagnosis not present

## 2022-10-11 LAB — RAD ONC ARIA SESSION SUMMARY
Course Elapsed Days: 42
Plan Fractions Treated to Date: 5
Plan Prescribed Dose Per Fraction: 1.8 Gy
Plan Total Fractions Prescribed: 13
Plan Total Prescribed Dose: 23.4 Gy
Reference Point Dosage Given to Date: 9 Gy
Reference Point Session Dosage Given: 1.8 Gy
Session Number: 30

## 2022-10-12 ENCOUNTER — Ambulatory Visit
Admission: RE | Admit: 2022-10-12 | Discharge: 2022-10-12 | Disposition: A | Discharge status: Home / Self Care | Payer: BC Managed Care – PPO | Source: Ambulatory Visit | Attending: Radiation Oncology | Admitting: Radiation Oncology

## 2022-10-12 ENCOUNTER — Other Ambulatory Visit: Payer: Self-pay

## 2022-10-12 DIAGNOSIS — Z510 Encounter for antineoplastic radiation therapy: Secondary | ICD-10-CM | POA: Diagnosis not present

## 2022-10-12 LAB — RAD ONC ARIA SESSION SUMMARY
Course Elapsed Days: 43
Plan Fractions Treated to Date: 6
Plan Prescribed Dose Per Fraction: 1.8 Gy
Plan Total Fractions Prescribed: 13
Plan Total Prescribed Dose: 23.4 Gy
Reference Point Dosage Given to Date: 10.8 Gy
Reference Point Session Dosage Given: 1.8 Gy
Session Number: 31

## 2022-10-13 ENCOUNTER — Ambulatory Visit
Admission: RE | Admit: 2022-10-13 | Discharge: 2022-10-13 | Disposition: A | Discharge status: Home / Self Care | Payer: BC Managed Care – PPO | Source: Ambulatory Visit | Attending: Radiation Oncology | Admitting: Radiation Oncology

## 2022-10-13 ENCOUNTER — Other Ambulatory Visit: Payer: Self-pay

## 2022-10-13 DIAGNOSIS — Z510 Encounter for antineoplastic radiation therapy: Secondary | ICD-10-CM | POA: Diagnosis not present

## 2022-10-13 LAB — RAD ONC ARIA SESSION SUMMARY
Course Elapsed Days: 44
Plan Fractions Treated to Date: 7
Plan Prescribed Dose Per Fraction: 1.8 Gy
Plan Total Fractions Prescribed: 13
Plan Total Prescribed Dose: 23.4 Gy
Reference Point Dosage Given to Date: 12.6 Gy
Reference Point Session Dosage Given: 1.8 Gy
Session Number: 32

## 2022-10-16 ENCOUNTER — Ambulatory Visit
Admission: RE | Admit: 2022-10-16 | Discharge: 2022-10-16 | Disposition: A | Discharge status: Home / Self Care | Payer: BC Managed Care – PPO | Source: Ambulatory Visit | Attending: Radiation Oncology | Admitting: Radiation Oncology

## 2022-10-16 ENCOUNTER — Other Ambulatory Visit: Payer: Self-pay

## 2022-10-16 DIAGNOSIS — Z510 Encounter for antineoplastic radiation therapy: Secondary | ICD-10-CM | POA: Diagnosis not present

## 2022-10-16 DIAGNOSIS — C61 Malignant neoplasm of prostate: Secondary | ICD-10-CM | POA: Diagnosis not present

## 2022-10-16 LAB — RAD ONC ARIA SESSION SUMMARY
Course Elapsed Days: 47
Plan Fractions Treated to Date: 8
Plan Prescribed Dose Per Fraction: 1.8 Gy
Plan Total Fractions Prescribed: 13
Plan Total Prescribed Dose: 23.4 Gy
Reference Point Dosage Given to Date: 14.4 Gy
Reference Point Session Dosage Given: 1.8 Gy
Session Number: 33

## 2022-10-17 ENCOUNTER — Other Ambulatory Visit: Payer: Self-pay

## 2022-10-17 ENCOUNTER — Ambulatory Visit
Admission: RE | Admit: 2022-10-17 | Discharge: 2022-10-17 | Disposition: A | Discharge status: Home / Self Care | Payer: BC Managed Care – PPO | Source: Ambulatory Visit | Attending: Radiation Oncology | Admitting: Radiation Oncology

## 2022-10-17 DIAGNOSIS — Z510 Encounter for antineoplastic radiation therapy: Secondary | ICD-10-CM | POA: Diagnosis not present

## 2022-10-17 LAB — RAD ONC ARIA SESSION SUMMARY
Course Elapsed Days: 48
Plan Fractions Treated to Date: 9
Plan Prescribed Dose Per Fraction: 1.8 Gy
Plan Total Fractions Prescribed: 13
Plan Total Prescribed Dose: 23.4 Gy
Reference Point Dosage Given to Date: 16.2 Gy
Reference Point Session Dosage Given: 1.8 Gy
Session Number: 34

## 2022-10-18 ENCOUNTER — Other Ambulatory Visit: Payer: Self-pay

## 2022-10-18 ENCOUNTER — Ambulatory Visit
Admission: RE | Admit: 2022-10-18 | Discharge: 2022-10-18 | Disposition: A | Discharge status: Home / Self Care | Payer: BC Managed Care – PPO | Source: Ambulatory Visit | Attending: Radiation Oncology | Admitting: Radiation Oncology

## 2022-10-18 DIAGNOSIS — Z510 Encounter for antineoplastic radiation therapy: Secondary | ICD-10-CM | POA: Diagnosis not present

## 2022-10-18 LAB — RAD ONC ARIA SESSION SUMMARY
Course Elapsed Days: 49
Plan Fractions Treated to Date: 10
Plan Prescribed Dose Per Fraction: 1.8 Gy
Plan Total Fractions Prescribed: 13
Plan Total Prescribed Dose: 23.4 Gy
Reference Point Dosage Given to Date: 18 Gy
Reference Point Session Dosage Given: 1.8 Gy
Session Number: 35

## 2022-10-19 ENCOUNTER — Other Ambulatory Visit: Payer: Self-pay

## 2022-10-19 ENCOUNTER — Ambulatory Visit
Admission: RE | Admit: 2022-10-19 | Discharge: 2022-10-19 | Disposition: A | Discharge status: Home / Self Care | Payer: BC Managed Care – PPO | Source: Ambulatory Visit | Attending: Radiation Oncology | Admitting: Radiation Oncology

## 2022-10-19 DIAGNOSIS — Z510 Encounter for antineoplastic radiation therapy: Secondary | ICD-10-CM | POA: Diagnosis not present

## 2022-10-19 LAB — RAD ONC ARIA SESSION SUMMARY
Course Elapsed Days: 50
Plan Fractions Treated to Date: 11
Plan Prescribed Dose Per Fraction: 1.8 Gy
Plan Total Fractions Prescribed: 13
Plan Total Prescribed Dose: 23.4 Gy
Reference Point Dosage Given to Date: 19.8 Gy
Reference Point Session Dosage Given: 1.8 Gy
Session Number: 36

## 2022-10-20 ENCOUNTER — Other Ambulatory Visit: Payer: Self-pay

## 2022-10-20 ENCOUNTER — Ambulatory Visit: Payer: BC Managed Care – PPO

## 2022-10-20 ENCOUNTER — Ambulatory Visit
Admission: RE | Admit: 2022-10-20 | Discharge: 2022-10-20 | Disposition: A | Discharge status: Home / Self Care | Payer: BC Managed Care – PPO | Source: Ambulatory Visit | Attending: Radiation Oncology | Admitting: Radiation Oncology

## 2022-10-20 DIAGNOSIS — Z510 Encounter for antineoplastic radiation therapy: Secondary | ICD-10-CM | POA: Diagnosis not present

## 2022-10-20 LAB — RAD ONC ARIA SESSION SUMMARY
Course Elapsed Days: 51
Plan Fractions Treated to Date: 12
Plan Prescribed Dose Per Fraction: 1.8 Gy
Plan Total Fractions Prescribed: 13
Plan Total Prescribed Dose: 23.4 Gy
Reference Point Dosage Given to Date: 21.6 Gy
Reference Point Session Dosage Given: 1.8 Gy
Session Number: 37

## 2022-10-23 ENCOUNTER — Encounter: Payer: Self-pay | Admitting: Urology

## 2022-10-23 ENCOUNTER — Other Ambulatory Visit: Payer: Self-pay

## 2022-10-23 ENCOUNTER — Ambulatory Visit
Admission: RE | Admit: 2022-10-23 | Discharge: 2022-10-23 | Disposition: A | Discharge status: Home / Self Care | Payer: BC Managed Care – PPO | Source: Ambulatory Visit | Attending: Radiation Oncology | Admitting: Radiation Oncology

## 2022-10-23 DIAGNOSIS — Z510 Encounter for antineoplastic radiation therapy: Secondary | ICD-10-CM | POA: Diagnosis not present

## 2022-10-23 LAB — RAD ONC ARIA SESSION SUMMARY
Course Elapsed Days: 54
Plan Fractions Treated to Date: 13
Plan Prescribed Dose Per Fraction: 1.8 Gy
Plan Total Fractions Prescribed: 13
Plan Total Prescribed Dose: 23.4 Gy
Reference Point Dosage Given to Date: 23.4 Gy
Reference Point Session Dosage Given: 1.8 Gy
Session Number: 38

## 2022-11-16 ENCOUNTER — Other Ambulatory Visit: Payer: Self-pay | Admitting: Urology

## 2022-11-16 DIAGNOSIS — C61 Malignant neoplasm of prostate: Secondary | ICD-10-CM

## 2022-11-16 NOTE — Progress Notes (Signed)
Patient was presented to the [facility]  on 08/08/22 for his biochemically recurrent prostate cancer with a current posttreatment PSA of 0.58 s/p RALP 11/2020 for Stage yp(T2 N0), Gleason 5+4 prostate cancer(PROTEUS).  Patient proceed with treatment recommendations of 7.5 week course of daily external beam therapy to the prostate fossa and pelvic lymph nodes, concurrent with ST-ADT and had his final radiation treatment on 10/23/22.   Patient is scheduled for a post treatment nurse call on 11/28/22 and is under active follow up's with Urology.   RN left voicemail to follow up to assess any additional questions or concerns.

## 2022-11-16 NOTE — Progress Notes (Signed)
  Radiation Oncology         [phone] ________________________________  Name: Gordon Scott MRN: [id number]  Date: 10/23/2022  DOB: 10-30-1965  End of Treatment Note  Diagnosis:   57 y.o. gentleman with biochemically recurrent prostate cancer with a current posttreatment PSA of 0.58 s/p RALP 11/2020 for Stage yp(T2 N0), Gleason 5+4 prostate cancer(PROTEUS).      Indication for treatment:  Curative, Definitive Radiotherapy       Radiation treatment dates:   08/30/22 - 10/23/22  Site/dose:  1. The prostate fossa and pelvic lymph nodes were initially treated to 45 Gy in 25 fractions of 1.8 Gy  2. The prostate fossa only was boosted to 68.4 Gy with 13 additional fractions of 1.8 Gy   Beams/energy:  1. The prostate fossa  and pelvic lymph nodes were initially treated using VMAT intensity modulated radiotherapy delivering 6 megavolt photons. Image guidance was performed with CB-CT studies prior to each fraction. He was immobilized with a body fix lower extremity mold.  2. The prostate fossa only was boosted using VMAT intensity modulated radiotherapy delivering 6 megavolt photons. Image guidance was performed with CB-CT studies prior to each fraction. He was immobilized with a body fix lower extremity mold.  Narrative: The patient tolerated radiation treatment relatively well.   The patient experienced some minor urinary irritation and modest fatigue.  He did experience some mild dysuria, nocturia x 1 and occasional diarrhea.  Plan: The patient has completed radiation treatment. He will return to radiation oncology clinic for routine followup in one month. I advised him to call or return sooner if he has any questions or concerns related to his recovery or treatment. ________________________________  Artist Pais. Kathrynn Running, M.D.

## 2022-11-28 ENCOUNTER — Ambulatory Visit
Admission: RE | Admit: 2022-11-28 | Discharge: 2022-11-28 | Disposition: A | Discharge status: Home / Self Care | Payer: BC Managed Care – PPO | Source: Ambulatory Visit | Attending: Radiation Oncology | Admitting: Radiation Oncology

## 2022-11-28 NOTE — Progress Notes (Signed)
  Radiation Oncology         [phone] ________________________________  Name: Gordon Scott MRN: [id number]  Date of Service: 11/28/2022  DOB: [birth year]  Post Treatment Telephone Note  Diagnosis:  57 y.o. gentleman with biochemically recurrent prostate cancer with a current posttreatment PSA of 0.58 s/p RALP 11/2020 for Stage yp(T2 N0), Gleason 5+4 prostate cancer(PROTEUS).       Indication for treatment:  Curative, Definitive Radiotherapy        Radiation treatment dates:   08/30/22 - 10/23/22(as documented in provider EOT note)  Pre Treatment IPSS Score: 2 (as documented in the provider consult note)  The patient was available for call today.   Symptoms of fatigue have improved mildly since completing therapy.  Symptoms of bladder changes have improved since completing therapy. Current symptoms include none, and medications for bladder symptoms include none.  Symptoms of bowel changes have improved since completing therapy. Current symptoms include none, and medications for bowel symptoms include none.    Post Treatment IPSS Score: IPSS Questionnaire (AUA-7): Over the past month.   1)  How often have you had a sensation of not emptying your bladder completely after you finish urinating?  0 - Not at all  2)  How often have you had to urinate again less than two hours after you finished urinating? 0 - Not at all  3)  How often have you found you stopped and started again several times when you urinated?  0 - Not at all  4) How difficult have you found it to postpone urination?  0 - Not at all  5) How often have you had a weak urinary stream?  0 - Not at all  6) How often have you had to push or strain to begin urination?  0 - Not at all  7) How many times did you most typically get up to urinate from the time you went to bed until the time you got up in the morning?  1 - 1 time  Total score:  1. Which indicates mild symptoms  0-7 mildly symptomatic   8-19 moderately  symptomatic   20-35 severely symptomatic    Patient has a scheduled follow up visit with his urologist, Dr. Laverle Patter, on 02/2023 for ongoing surveillance. He was counseled that PSA levels will be drawn in the urology office, and was reassured that additional time is expected to improve bowel and bladder symptoms. He was encouraged to call back with concerns or questions regarding radiation.  This concludes the interview.   Ruel Favors, LPN

## 2022-11-30 ENCOUNTER — Other Ambulatory Visit (HOSPITAL_COMMUNITY): Payer: Self-pay | Admitting: Urology

## 2022-11-30 DIAGNOSIS — C61 Malignant neoplasm of prostate: Secondary | ICD-10-CM

## 2022-12-07 ENCOUNTER — Other Ambulatory Visit: Payer: Self-pay | Admitting: *Deleted

## 2022-12-18 ENCOUNTER — Other Ambulatory Visit (HOSPITAL_COMMUNITY): Payer: Self-pay | Admitting: Urology

## 2022-12-18 DIAGNOSIS — C61 Malignant neoplasm of prostate: Secondary | ICD-10-CM

## 2023-01-04 ENCOUNTER — Inpatient Hospital Stay: Payer: BC Managed Care – PPO | Attending: Adult Health | Admitting: *Deleted

## 2023-01-04 ENCOUNTER — Encounter: Payer: Self-pay | Admitting: *Deleted

## 2023-01-05 ENCOUNTER — Encounter: Payer: Self-pay | Admitting: *Deleted

## 2023-01-05 NOTE — Progress Notes (Signed)
SCP treatment summary mailed to pt and to PCP.

## 2023-01-12 ENCOUNTER — Ambulatory Visit (HOSPITAL_COMMUNITY)
Admission: RE | Admit: 2023-01-12 | Discharge: 2023-01-12 | Disposition: A | Discharge status: Home / Self Care | Payer: BC Managed Care – PPO | Source: Ambulatory Visit | Attending: Urology | Admitting: Urology

## 2023-01-12 ENCOUNTER — Other Ambulatory Visit (HOSPITAL_COMMUNITY): Payer: Self-pay | Admitting: Urology

## 2023-01-12 DIAGNOSIS — C61 Malignant neoplasm of prostate: Secondary | ICD-10-CM

## 2023-01-23 ENCOUNTER — Ambulatory Visit (HOSPITAL_COMMUNITY)
Admission: RE | Admit: 2023-01-23 | Discharge: 2023-01-23 | Disposition: A | Discharge status: Home / Self Care | Payer: BC Managed Care – PPO | Source: Ambulatory Visit | Attending: Urology | Admitting: Urology

## 2023-01-23 DIAGNOSIS — C61 Malignant neoplasm of prostate: Secondary | ICD-10-CM | POA: Diagnosis not present

## 2023-01-23 MED ORDER — PIFLIFOLASTAT F 18 (PYLARIFY) INJECTION
9.0000 | Freq: Once | INTRAVENOUS | Status: AC
  Administered 2023-01-23: 9 via INTRAVENOUS

## 2023-01-24 ENCOUNTER — Encounter (HOSPITAL_COMMUNITY)
Admission: RE | Admit: 2023-01-24 | Discharge: 2023-01-24 | Disposition: A | Discharge status: Home / Self Care | Payer: BC Managed Care – PPO | Source: Ambulatory Visit | Attending: Urology | Admitting: Urology

## 2023-01-24 DIAGNOSIS — C61 Malignant neoplasm of prostate: Secondary | ICD-10-CM | POA: Diagnosis not present

## 2023-01-24 MED ORDER — TECHNETIUM TC 99M MEDRONATE IV KIT
20.0000 | PACK | Freq: Once | INTRAVENOUS | Status: AC
  Administered 2023-01-24: 18.7 via INTRAVENOUS

## 2023-07-27 ENCOUNTER — Other Ambulatory Visit (HOSPITAL_COMMUNITY): Payer: Self-pay | Admitting: Urology

## 2023-07-27 ENCOUNTER — Encounter (HOSPITAL_COMMUNITY): Payer: Self-pay | Admitting: Urology

## 2023-07-27 DIAGNOSIS — C61 Malignant neoplasm of prostate: Secondary | ICD-10-CM

## 2023-07-30 ENCOUNTER — Encounter (HOSPITAL_COMMUNITY)
Admission: RE | Admit: 2023-07-30 | Discharge: 2023-07-30 | Disposition: A | Discharge status: Home / Self Care | Payer: BC Managed Care – PPO | Source: Ambulatory Visit | Attending: Urology | Admitting: Urology

## 2023-07-30 DIAGNOSIS — C61 Malignant neoplasm of prostate: Secondary | ICD-10-CM | POA: Insufficient documentation

## 2023-08-07 ENCOUNTER — Ambulatory Visit (HOSPITAL_COMMUNITY)
Admission: RE | Admit: 2023-08-07 | Discharge: 2023-08-07 | Disposition: A | Discharge status: Home / Self Care | Payer: Self-pay | Source: Ambulatory Visit | Attending: Urology | Admitting: Urology

## 2023-08-07 DIAGNOSIS — C61 Malignant neoplasm of prostate: Secondary | ICD-10-CM | POA: Insufficient documentation

## 2023-08-07 MED ORDER — PIFLIFOLASTAT F 18 (PYLARIFY) INJECTION
9.0000 | Freq: Once | INTRAVENOUS | Status: AC
  Administered 2023-08-07: 8.7 via INTRAVENOUS

## 2023-08-09 ENCOUNTER — Ambulatory Visit (HOSPITAL_COMMUNITY)
Admission: RE | Admit: 2023-08-09 | Discharge: 2023-08-09 | Disposition: A | Discharge status: Home / Self Care | Payer: Self-pay | Source: Ambulatory Visit | Attending: Urology | Admitting: Urology

## 2023-08-09 DIAGNOSIS — C61 Malignant neoplasm of prostate: Secondary | ICD-10-CM

## 2023-08-09 MED ORDER — TECHNETIUM TC 99M MEDRONATE IV KIT
20.0000 | PACK | Freq: Once | INTRAVENOUS | Status: AC | PRN
  Administered 2023-08-09: 18.92 via INTRAVENOUS

## 2023-10-12 IMAGING — CT NM PET TUM IMG SKULL BASE T - THIGH
1 of 7 series · 1 of 25 positions shown · non-contrast
Comparison: 12/21/2020 bone scan. 12/21/2020 chest abdomen and
pelvic CTs.

CLINICAL DATA: Status post prostatectomy and androgen deprivation
therapy.

EXAM:
NUCLEAR MEDICINE PET SKULL BASE TO THIGH
TECHNIQUE: 8.3 mCi F18 Piflufolastat (Pylarify) was injected intravenously.
Full-ring PET imaging was performed from the skull base to thigh
after the radiotracer. CT data was obtained and used for attenuation
correction and anatomic localization.

[Series 3: pet sk_thigh ac · axial · 5.0mm · 4.07mm/px · 1 of 262 slices shown]
[im 157/262]
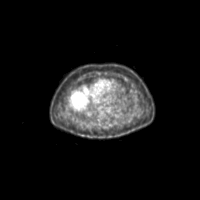

[1 of 25 positions shown; findings below may reference images not displayed]

FINDINGS: NECK

No radiotracer activity in neck lymph nodes.

Incidental CT finding: No cervical adenopathy.

CHEST

No radiotracer accumulation within mediastinal or hilar lymph nodes.
No suspicious pulmonary nodules on the CT scan.

Incidental CT finding: Mild cardiomegaly.  Small hiatal hernia.

ABDOMEN/PELVIS

Prostate: No focal activity in the prostate bed.

Lymph nodes: No abnormal radiotracer accumulation within pelvic or
abdominal nodes.

Liver: No evidence of liver metastasis

Incidental CT finding: Moderate hepatic steatosis. Normal imaged
portions of the spleen, pancreas, gallbladder, adrenal glands,
kidneys. Decompressed urinary bladder. Small bilateral fat
containing inguinal hernias. Ventral abdominal wall fat containing
hernia and laxity.

SKELETON

No focal  activity to suggest skeletal metastasis.

Cervical spine fixation.  Marked bilateral gynecomastia.
IMPRESSION: Status post prostatectomy. No findings of tracer avid recurrent or
metastatic disease.

Hepatic steatosis.

Small hiatal hernia.

Gynecomastia.

## 2023-11-08 ENCOUNTER — Other Ambulatory Visit (HOSPITAL_COMMUNITY): Payer: Self-pay | Admitting: Urology

## 2023-11-08 DIAGNOSIS — C61 Malignant neoplasm of prostate: Secondary | ICD-10-CM

## 2024-02-04 ENCOUNTER — Encounter (HOSPITAL_COMMUNITY)
Admission: RE | Admit: 2024-02-04 | Discharge: 2024-02-04 | Disposition: A | Discharge status: Home / Self Care | Payer: Self-pay | Source: Ambulatory Visit | Attending: Urology | Admitting: Urology

## 2024-02-04 DIAGNOSIS — C61 Malignant neoplasm of prostate: Secondary | ICD-10-CM | POA: Insufficient documentation

## 2024-02-04 MED ORDER — TECHNETIUM TC 99M MEDRONATE IV KIT
20.0000 | PACK | Freq: Once | INTRAVENOUS | Status: AC | PRN
  Administered 2024-02-04: 19.114 via INTRAVENOUS

## 2024-02-08 ENCOUNTER — Encounter (HOSPITAL_COMMUNITY)
Admission: RE | Admit: 2024-02-08 | Discharge: 2024-02-08 | Disposition: A | Discharge status: Home / Self Care | Payer: Self-pay | Source: Ambulatory Visit | Attending: Urology | Admitting: Urology

## 2024-02-08 DIAGNOSIS — C61 Malignant neoplasm of prostate: Secondary | ICD-10-CM | POA: Insufficient documentation

## 2024-02-08 MED ORDER — PIFLIFOLASTAT F 18 (PYLARIFY) INJECTION
9.0000 | Freq: Once | INTRAVENOUS | Status: AC
  Administered 2024-02-08: 9.427 via INTRAVENOUS

## 2024-06-18 ENCOUNTER — Other Ambulatory Visit: Payer: Self-pay | Admitting: Urology

## 2024-06-18 DIAGNOSIS — C61 Malignant neoplasm of prostate: Secondary | ICD-10-CM

## 2024-06-25 ENCOUNTER — Other Ambulatory Visit (HOSPITAL_COMMUNITY): Payer: Self-pay | Admitting: Urology

## 2024-06-25 DIAGNOSIS — C61 Malignant neoplasm of prostate: Secondary | ICD-10-CM

## 2024-06-26 ENCOUNTER — Other Ambulatory Visit (HOSPITAL_COMMUNITY): Payer: Self-pay | Admitting: Urology

## 2024-06-26 DIAGNOSIS — C61 Malignant neoplasm of prostate: Secondary | ICD-10-CM

## 2024-07-29 ENCOUNTER — Ambulatory Visit (HOSPITAL_COMMUNITY)
Admission: RE | Admit: 2024-07-29 | Discharge: 2024-07-29 | Disposition: A | Payer: Self-pay | Source: Ambulatory Visit | Attending: Urology | Admitting: Urology

## 2024-07-29 ENCOUNTER — Ambulatory Visit (HOSPITAL_COMMUNITY)
Admission: RE | Admit: 2024-07-29 | Discharge: 2024-07-29 | Disposition: A | Discharge status: Home / Self Care | Payer: Self-pay | Source: Ambulatory Visit | Attending: Urology | Admitting: Urology

## 2024-07-29 ENCOUNTER — Encounter (HOSPITAL_COMMUNITY): Payer: Self-pay

## 2024-07-29 ENCOUNTER — Ambulatory Visit (HOSPITAL_COMMUNITY): Payer: Self-pay

## 2024-07-29 ENCOUNTER — Encounter (HOSPITAL_COMMUNITY)
Admission: RE | Admit: 2024-07-29 | Discharge: 2024-07-29 | Disposition: A | Discharge status: Home / Self Care | Payer: Self-pay | Source: Ambulatory Visit | Attending: Urology | Admitting: Urology

## 2024-07-29 DIAGNOSIS — C61 Malignant neoplasm of prostate: Secondary | ICD-10-CM | POA: Insufficient documentation

## 2024-07-29 MED ORDER — TECHNETIUM TC 99M MEDRONATE IV KIT
20.0000 | PACK | Freq: Once | INTRAVENOUS | Status: AC | PRN
  Administered 2024-07-29: 19.44 via INTRAVENOUS

## 2024-07-31 ENCOUNTER — Encounter (HOSPITAL_COMMUNITY)
Admission: RE | Admit: 2024-07-31 | Discharge: 2024-07-31 | Disposition: A | Discharge status: Home / Self Care | Payer: Self-pay | Source: Ambulatory Visit | Attending: Urology | Admitting: Urology

## 2024-07-31 DIAGNOSIS — C61 Malignant neoplasm of prostate: Secondary | ICD-10-CM | POA: Insufficient documentation

## 2024-07-31 MED ORDER — PIFLIFOLASTAT F 18 (PYLARIFY) INJECTION
9.0000 | Freq: Once | INTRAVENOUS | Status: AC
  Administered 2024-07-31: 8.16 via INTRAVENOUS

## 2024-08-13 ENCOUNTER — Telehealth: Payer: Self-pay | Admitting: Urology

## 2024-08-13 NOTE — Telephone Encounter (Signed)
 Unable to LVM to schedule consult with Dr. Bedelia Rusk to either number due to full inbox.

## 2024-08-21 NOTE — Progress Notes (Incomplete)
 Histology and Location of Primary Cancer: Prostate   Sites of Visceral and Bony Metastatic Disease: L4 vertebral body and lateral aspect of the T7 rib  Location(s) of Symptomatic Metastases:  L4 vertebral body and lateral aspect of the T7 rib   Past/Anticipated chemotherapy by medical oncology, if any: ***  Pain on a scale of 0-10 is: {Number; 1-10  not applicable:20727}    If Spine Met(s), symptoms, if any, include: Bowel/Bladder retention or incontinence (please describe): *** Numbness or weakness in extremities (please describe): *** Current Decadron  regimen, if applicable: ***  Ambulatory status? Walker? Wheelchair?: {VQI Ambulatory Status:20974}  SAFETY ISSUES: Prior radiation?  Yes, The prostate fossa and pelvic lymph nodes were initially treated to 45 Gy in 25 fractions of 1.8 Gy. The prostate fossa only was boosted to 68.4 Gy with 13 additional fractions of 1.8 Gy  Pacemaker/ICD? {:18581} Possible current pregnancy? no Is the patient on methotrexate? {:18581}  Current Complaints / other details:  ***

## 2024-08-28 ENCOUNTER — Ambulatory Visit: Admitting: Urology

## 2024-08-28 ENCOUNTER — Ambulatory Visit: Admitting: Radiation Oncology

## 2024-08-28 ENCOUNTER — Ambulatory Visit
